# Patient Record
Sex: Male | Born: 1965 | Race: White | Hispanic: No | Marital: Married | Smoking: Never smoker
Health system: Southern US, Community
[De-identification: ages and names within clinical notes are randomized; demographics above are authoritative.]

## PROBLEM LIST (undated history)

## (undated) DIAGNOSIS — N200 Calculus of kidney: Secondary | ICD-10-CM

## (undated) DIAGNOSIS — Z87442 Personal history of urinary calculi: Secondary | ICD-10-CM

## (undated) DIAGNOSIS — K219 Gastro-esophageal reflux disease without esophagitis: Secondary | ICD-10-CM

## (undated) DIAGNOSIS — I10 Essential (primary) hypertension: Secondary | ICD-10-CM

## (undated) HISTORY — PX: KNEE ARTHROSCOPY W/ MENISCAL REPAIR: SHX1877

---

## 1998-11-11 ENCOUNTER — Encounter: Payer: Self-pay | Admitting: Otolaryngology

## 1998-11-11 ENCOUNTER — Encounter: Admission: RE | Admit: 1998-11-11 | Discharge: 1998-11-11 | Payer: Self-pay | Admitting: Otolaryngology

## 2002-03-13 ENCOUNTER — Ambulatory Visit (HOSPITAL_COMMUNITY): Admission: RE | Admit: 2002-03-13 | Discharge: 2002-03-13 | Payer: Self-pay | Admitting: Gastroenterology

## 2002-03-13 ENCOUNTER — Encounter: Payer: Self-pay | Admitting: Gastroenterology

## 2002-03-25 ENCOUNTER — Ambulatory Visit (HOSPITAL_COMMUNITY): Admission: RE | Admit: 2002-03-25 | Discharge: 2002-03-25 | Payer: Self-pay | Admitting: Gastroenterology

## 2002-03-25 ENCOUNTER — Encounter: Payer: Self-pay | Admitting: Gastroenterology

## 2004-02-20 ENCOUNTER — Ambulatory Visit (HOSPITAL_COMMUNITY): Admission: RE | Admit: 2004-02-20 | Discharge: 2004-02-20 | Payer: Self-pay | Admitting: *Deleted

## 2004-03-10 ENCOUNTER — Encounter (INDEPENDENT_AMBULATORY_CARE_PROVIDER_SITE_OTHER): Payer: Self-pay | Admitting: Specialist

## 2004-03-10 ENCOUNTER — Ambulatory Visit (HOSPITAL_COMMUNITY): Admission: RE | Admit: 2004-03-10 | Discharge: 2004-03-10 | Payer: Self-pay | Admitting: *Deleted

## 2006-07-03 ENCOUNTER — Emergency Department (HOSPITAL_COMMUNITY): Admission: EM | Admit: 2006-07-03 | Discharge: 2006-07-04 | Payer: Self-pay | Admitting: Emergency Medicine

## 2007-07-06 ENCOUNTER — Encounter: Admission: RE | Admit: 2007-07-06 | Discharge: 2007-07-06 | Payer: Self-pay | Admitting: Family Medicine

## 2010-11-18 ENCOUNTER — Encounter: Payer: Self-pay | Admitting: Gastroenterology

## 2010-12-29 ENCOUNTER — Other Ambulatory Visit: Payer: Self-pay | Admitting: Gastroenterology

## 2012-09-03 ENCOUNTER — Other Ambulatory Visit (HOSPITAL_COMMUNITY): Payer: Self-pay | Admitting: Orthopaedic Surgery

## 2012-12-29 ENCOUNTER — Encounter (HOSPITAL_COMMUNITY): Payer: Self-pay | Admitting: Emergency Medicine

## 2012-12-29 ENCOUNTER — Emergency Department (HOSPITAL_COMMUNITY): Payer: PRIVATE HEALTH INSURANCE

## 2012-12-29 ENCOUNTER — Emergency Department (HOSPITAL_COMMUNITY)
Admission: EM | Admit: 2012-12-29 | Discharge: 2012-12-29 | Disposition: A | Discharge status: Home / Self Care | Payer: PRIVATE HEALTH INSURANCE | Attending: Emergency Medicine | Admitting: Emergency Medicine

## 2012-12-29 DIAGNOSIS — R002 Palpitations: Secondary | ICD-10-CM | POA: Insufficient documentation

## 2012-12-29 DIAGNOSIS — I10 Essential (primary) hypertension: Secondary | ICD-10-CM | POA: Insufficient documentation

## 2012-12-29 DIAGNOSIS — K219 Gastro-esophageal reflux disease without esophagitis: Secondary | ICD-10-CM | POA: Insufficient documentation

## 2012-12-29 DIAGNOSIS — Z79899 Other long term (current) drug therapy: Secondary | ICD-10-CM | POA: Insufficient documentation

## 2012-12-29 HISTORY — DX: Essential (primary) hypertension: I10

## 2012-12-29 HISTORY — DX: Gastro-esophageal reflux disease without esophagitis: K21.9

## 2012-12-29 LAB — CBC
HCT: 46 % (ref 39.0–52.0)
Hemoglobin: 15.6 g/dL (ref 13.0–17.0)
MCH: 29.7 pg (ref 26.0–34.0)
MCHC: 33.9 g/dL (ref 30.0–36.0)
MCV: 87.5 fL (ref 78.0–100.0)
Platelets: 257 10*3/uL (ref 150–400)
WBC: 8.7 10*3/uL (ref 4.0–10.5)

## 2012-12-29 LAB — POCT I-STAT TROPONIN I: Troponin i, poc: 0 ng/mL (ref 0.00–0.08)

## 2012-12-29 LAB — BASIC METABOLIC PANEL
BUN: 12 mg/dL (ref 6–23)
Calcium: 9.3 mg/dL (ref 8.4–10.5)
Creatinine, Ser: 0.9 mg/dL (ref 0.50–1.35)
GFR calc non Af Amer: >90 mL/min (ref >90)
Glucose, Bld: 101 mg/dL — ABNORMAL HIGH (ref 70–99)
Potassium: 3.6 mEq/L (ref 3.5–5.1)

## 2012-12-29 NOTE — ED Provider Notes (Signed)
Complains of irregular heartbeat for approximately 3 days. Has been constant since 9 AM today. Presently asymptomatic. He denies having had any chest pain shortness of breath or nausea. On exam no distress lungs clear auscultation heart regular rate and rhythm no murmurs or rubs abdomen obese nontender extremities without edema. Patient had very occasional PVCs on heart monitor, otherwise normal sinus rhythm. Plan referral to primary care physician for event monitor  Doug Sou, MD 12/29/12 1242

## 2012-12-29 NOTE — ED Notes (Addendum)
Per pt sts for a few days he has been having intermittent chest pain and yesterday it began to be constant. sts pain is mid sternal and epigastric. Denies N,V,diaphoresis or SOB. sts he thought it was gas and took gas x without relief. sts he feels a fluttering in his chest

## 2012-12-29 NOTE — ED Provider Notes (Signed)
CSN: [id number]     Arrival date & time 12/29/12  1043 History   First MD Initiated Contact with Patient 12/29/12 1110     Chief Complaint  Patient presents with  . Chest Pain   (Consider location/radiation/quality/duration/timing/severity/associated sxs/prior Treatment) HPI Comments: Patient presents today with a chief complaint of palpitations.  He reports that he has had palpitations intermittently for the past 3 days.  He reports that the palpitations have been constant since 9 AM this morning.  He reports that he has had similar symptoms in the past with excessive caffeine intake. He reports that he typically drinks a can of Coke every morning, but denies any other caffeine use.  He denies any caffeine intake today.  He denies alcohol or recreational drug use.  Denies anxiety.  He denies any chest pain, SOB, nausea, vomiting, numbness, or tingling.  He denies any prior cardiac history.  No history of Cardiac Arrythmia.  He has a history of HTN, but no history of Hyperlipidemia or DM.  PCP is [facility].    The history is provided by the patient.    Past Medical History  Diagnosis Date  . Hypertension   . Acid reflux    History reviewed. No pertinent past surgical history. History reviewed. No pertinent family history. History  Substance Use Topics  . Smoking status: Never Smoker   . Smokeless tobacco: Not on file  . Alcohol Use: No    Review of Systems  Cardiovascular: Positive for palpitations.  All other systems reviewed and are negative.    Allergies  Bee venom  Home Medications   Current Outpatient Rx  Name  Route  Sig  Dispense  Refill  . EPINEPHrine (EPI-PEN) 0.3 mg/0.3 mL SOAJ injection   Intramuscular   Inject 0.3 mg into the muscle once.         . lansoprazole (PREVACID) 30 MG capsule   Oral   Take 30 mg by mouth daily at 12 noon.         Marland Kitchen lisinopril-hydrochlorothiazide (PRINZIDE,ZESTORETIC) 10-12.5 MG per tablet   Oral   Take 1 tablet  by mouth daily.          BP 124/77  Pulse 87  Temp(Src) 98.3 F (36.8 C)  Resp 15  SpO2 95% Physical Exam  Nursing note and vitals reviewed. Constitutional: He appears well-developed and well-nourished.  HENT:  Head: Normocephalic and atraumatic.  Mouth/Throat: Oropharynx is clear and moist.  Neck: Normal range of motion. Neck supple.  Cardiovascular: Normal rate, regular rhythm and normal heart sounds.   Pulmonary/Chest: Effort normal and breath sounds normal. No respiratory distress. He has no wheezes. He has no rales. He exhibits no tenderness.  Abdominal: Soft. Bowel sounds are normal. There is no tenderness.  Musculoskeletal: Normal range of motion.  Neurological: He is alert.  Skin: Skin is warm and dry.  Psychiatric: He has a normal mood and affect.    ED Course  Procedures (including critical care time) Labs Review Labs Reviewed  BASIC METABOLIC PANEL - Abnormal; Notable for the following:    Glucose, Bld 101 (*)    All other components within normal limits  CBC  POCT I-STAT TROPONIN I   Imaging Review Dg Chest 2 View  12/29/2012   CLINICAL DATA:  Chest palpitations  EXAM: CHEST  2 VIEW  COMPARISON:  10/31/2009  FINDINGS: The heart size and mediastinal contours are within normal limits. Both lungs are clear. The visualized skeletal structures are unremarkable.  IMPRESSION:  No active cardiopulmonary disease.   Electronically Signed   By: Signa Kell M.D.   On: 12/29/2012 11:45    EKG Interpretation   None       MDM  No diagnosis found. Patient presenting with a chief complaint of palpitations.  No prior cardiac history.  He denies CP or SOB.  Occasional PVC's observed on the cardiac monitor during ED course.  Labs unremarkable.  Troponin negative.  CXR negative.  Feel that the patient is stable for discharge.  Patient instructed to follow up with PCP for event monitor.  Return precautions given.   Santiago Glad, PA-C 12/29/12 2009

## 2012-12-29 NOTE — ED Notes (Signed)
Pt returned from xray

## 2012-12-30 NOTE — ED Provider Notes (Signed)
Medical screening examination/treatment/procedure(s) were conducted as a shared visit with non-physician practitioner(s) and myself.  I personally evaluated the patient during the encounter.  EKG Interpretation    Date/Time:  Saturday December 29 2012 10:46:40 EST Ventricular Rate:  95 PR Interval:  148 QRS Duration: 104 QT Interval:  350 QTC Calculation: 439 R Axis:   79 Text Interpretation:  Normal sinus rhythm Incomplete right bundle branch block Borderline ECG No old tracing to compare Confirmed by Ethelda Chick  MD, Duane Trias ([id number]) on 12/29/2012 12:40:41 PM             Doug Sou, MD 12/30/12 [phone]

## 2013-01-20 ENCOUNTER — Emergency Department (HOSPITAL_COMMUNITY)
Admission: EM | Admit: 2013-01-20 | Discharge: 2013-01-20 | Disposition: A | Discharge status: Home / Self Care | Payer: PRIVATE HEALTH INSURANCE | Attending: Emergency Medicine | Admitting: Emergency Medicine

## 2013-01-20 ENCOUNTER — Encounter (HOSPITAL_COMMUNITY): Payer: Self-pay | Admitting: Emergency Medicine

## 2013-01-20 ENCOUNTER — Emergency Department (HOSPITAL_COMMUNITY): Payer: PRIVATE HEALTH INSURANCE

## 2013-01-20 DIAGNOSIS — W010XXA Fall on same level from slipping, tripping and stumbling without subsequent striking against object, initial encounter: Secondary | ICD-10-CM | POA: Insufficient documentation

## 2013-01-20 DIAGNOSIS — Z79899 Other long term (current) drug therapy: Secondary | ICD-10-CM | POA: Insufficient documentation

## 2013-01-20 DIAGNOSIS — Y9367 Activity, basketball: Secondary | ICD-10-CM | POA: Insufficient documentation

## 2013-01-20 DIAGNOSIS — S0003XA Contusion of scalp, initial encounter: Secondary | ICD-10-CM | POA: Insufficient documentation

## 2013-01-20 DIAGNOSIS — W19XXXA Unspecified fall, initial encounter: Secondary | ICD-10-CM

## 2013-01-20 DIAGNOSIS — I10 Essential (primary) hypertension: Secondary | ICD-10-CM | POA: Insufficient documentation

## 2013-01-20 DIAGNOSIS — Y92009 Unspecified place in unspecified non-institutional (private) residence as the place of occurrence of the external cause: Secondary | ICD-10-CM | POA: Insufficient documentation

## 2013-01-20 DIAGNOSIS — K219 Gastro-esophageal reflux disease without esophagitis: Secondary | ICD-10-CM | POA: Insufficient documentation

## 2013-01-20 DIAGNOSIS — S0093XA Contusion of unspecified part of head, initial encounter: Secondary | ICD-10-CM

## 2013-01-20 NOTE — ED Provider Notes (Signed)
CSN: [id number]     Arrival date & time 01/20/13  1132 History  This chart was scribed for non-physician practitioner, Marlon Pel, PA-C working with Geoffery Lyons, MD by Greggory Stallion, ED scribe. This patient was seen in room TR09C/TR09C and the patient's care was started at 12:19 PM.   Chief Complaint  Patient presents with  . Fall  . Head Injury   The history is provided by the patient. No language interpreter was used.   HPI Comments: Scott Knox is a 47 y.o. male who presents to the Emergency Department complaining of a fall that occurred yesterday. States he tripped on a basketball goal while putting it together and fell face forward. Pt has gradual onset, constant headache above his right eye and mild jaw pain. He states he had intermittent dizziness but it is resolved currently. Pt has had two episodes of dizziness. Denies LOC. Pt states he has been ambulating normally. Denies extremity weakness, difficulty swallowing, speech difficulty.    Past Medical History  Diagnosis Date  . Hypertension   . Acid reflux    History reviewed. No pertinent past surgical history. History reviewed. No pertinent family history. History  Substance Use Topics  . Smoking status: Never Smoker   . Smokeless tobacco: Not on file  . Alcohol Use: No    Review of Systems  HENT: Negative for trouble swallowing.   Musculoskeletal: Positive for arthralgias (jaw).  Neurological: Positive for headaches. Negative for dizziness, speech difficulty and weakness.  All other systems reviewed and are negative.   Allergies  Bee venom  Home Medications   Current Outpatient Rx  Name  Route  Sig  Dispense  Refill  . EPINEPHrine (EPI-PEN) 0.3 mg/0.3 mL SOAJ injection   Intramuscular   Inject 0.3 mg into the muscle once.         . lansoprazole (PREVACID) 30 MG capsule   Oral   Take 30 mg by mouth daily at 12 noon.         Marland Kitchen lisinopril-hydrochlorothiazide (PRINZIDE,ZESTORETIC) 10-12.5 MG per  tablet   Oral   Take 1 tablet by mouth daily.          BP 153/100  Pulse 97  Temp(Src) 98.3 F (36.8 C) (Oral)  Resp 18  SpO2 95%  Physical Exam  Nursing note and vitals reviewed. Constitutional: He is oriented to person, place, and time. He appears well-developed and well-nourished. No distress.  HENT:  Head: Normocephalic and atraumatic.  Eyes: EOM are normal. Pupils are equal, round, and reactive to light.  Racoon eye to right eye. Negative battle signs.   Neck: Neck supple. No tracheal deviation present.  No cervical tenderness.   Cardiovascular: Normal rate.   Pulmonary/Chest: Effort normal. No respiratory distress.  Musculoskeletal: Normal range of motion.  Neurological: He is alert and oriented to person, place, and time. He has normal strength. No cranial nerve deficit or sensory deficit. He displays a negative Romberg sign. GCS eye subscore is 4. GCS verbal subscore is 5. GCS motor subscore is 6.  Skin: Skin is warm and dry.  Psychiatric: He has a normal mood and affect. His behavior is normal.    ED Course  Procedures (including critical care time)  DIAGNOSTIC STUDIES: Oxygen Saturation is 95% on RA, adequate by my interpretation.    COORDINATION OF CARE: 12:22 PM-Discussed treatment plan which includes head CT with pt at bedside and pt agreed to plan.   Labs Review Labs Reviewed - No data to display Imaging Review  Ct Head Wo Contrast  01/20/2013   CLINICAL DATA:  Status post fall 1 day ago.  Headache.  EXAM: CT HEAD WITHOUT CONTRAST  TECHNIQUE: Contiguous axial images were obtained from the base of the skull through the vertex without intravenous contrast.  COMPARISON:  None.  FINDINGS: The brain appears normal without infarct, hemorrhage, mass lesion, mass effect, midline shift or abnormal extra-axial fluid collection. There is no hydrocephalus or pneumocephalus. The calvarium is intact. Imaged paranasal sinuses and mastoid air cells are clear.  IMPRESSION:  Negative head CT.   Electronically Signed   By: Drusilla Kanner M.D.   On: 01/20/2013 12:53    EKG Interpretation   None       MDM   1. Fall at home, initial encounter   2. Head contusion, initial encounter    Normal Head CT, normal neuro exam. Dizziness is most likely due to concussion. Pt to follow-up with PCP.  47 y.o.Scott Knox Shoulder evaluation in the Emergency Department is complete. It has been determined that no acute conditions requiring further emergency intervention are present at this time. The patient/guardian have been advised of the diagnosis and plan. We have discussed signs and symptoms that warrant return to the ED, such as changes or worsening in symptoms.  Vital signs are stable at discharge. Filed Vitals:   01/20/13 1154  BP: 153/100  Pulse: 97  Temp: 98.3 F (36.8 C)  Resp: 18    Patient/guardian has voiced understanding and agreed to follow-up with the PCP or specialist.  I personally performed the services described in this documentation, which was scribed in my presence. The recorded information has been reviewed and is accurate.   [name], PA-C 01/20/13 1311

## 2013-01-20 NOTE — ED Provider Notes (Signed)
Medical screening examination/treatment/procedure(s) were performed by non-physician practitioner and as supervising physician I was immediately available for consultation/collaboration.     Deserai Cansler, MD 01/20/13 1452 

## 2013-01-20 NOTE — ED Notes (Signed)
Pt sts fell onto face yesterday after tripping and c/o HA today above right eye with mild dizziness that is resolved; pt denies LOC

## 2019-01-16 ENCOUNTER — Ambulatory Visit: Payer: PRIVATE HEALTH INSURANCE | Attending: Internal Medicine

## 2019-01-16 DIAGNOSIS — Z20822 Contact with and (suspected) exposure to covid-19: Secondary | ICD-10-CM

## 2019-01-17 LAB — NOVEL CORONAVIRUS, NAA: SARS-CoV-2, NAA: NOT DETECTED

## 2019-02-09 ENCOUNTER — Emergency Department: Payer: PRIVATE HEALTH INSURANCE

## 2019-02-09 ENCOUNTER — Other Ambulatory Visit: Payer: Self-pay

## 2019-02-09 ENCOUNTER — Emergency Department
Admission: EM | Admit: 2019-02-09 | Discharge: 2019-02-09 | Disposition: A | Discharge status: Home / Self Care | Payer: PRIVATE HEALTH INSURANCE | Attending: Emergency Medicine | Admitting: Emergency Medicine

## 2019-02-09 DIAGNOSIS — I10 Essential (primary) hypertension: Secondary | ICD-10-CM | POA: Diagnosis not present

## 2019-02-09 DIAGNOSIS — N200 Calculus of kidney: Secondary | ICD-10-CM | POA: Insufficient documentation

## 2019-02-09 DIAGNOSIS — R109 Unspecified abdominal pain: Secondary | ICD-10-CM | POA: Diagnosis present

## 2019-02-09 DIAGNOSIS — Z79899 Other long term (current) drug therapy: Secondary | ICD-10-CM | POA: Insufficient documentation

## 2019-02-09 DIAGNOSIS — R197 Diarrhea, unspecified: Secondary | ICD-10-CM | POA: Diagnosis not present

## 2019-02-09 DIAGNOSIS — R3915 Urgency of urination: Secondary | ICD-10-CM | POA: Insufficient documentation

## 2019-02-09 LAB — URINALYSIS, COMPLETE (UACMP) WITH MICROSCOPIC
Bacteria, UA: NONE SEEN
Bilirubin Urine: NEGATIVE
Glucose, UA: NEGATIVE mg/dL
Ketones, ur: NEGATIVE mg/dL
Leukocytes,Ua: NEGATIVE
Nitrite: NEGATIVE
Protein, ur: NEGATIVE mg/dL
RBC / HPF: >50 RBC/hpf — ABNORMAL HIGH (ref 0–5)
Specific Gravity, Urine: 1.015 (ref 1.005–1.030)
Squamous Epithelial / LPF: NONE SEEN (ref 0–5)
pH: 5 (ref 5.0–8.0)

## 2019-02-09 LAB — CBC
HCT: 45.3 % (ref 39.0–52.0)
Hemoglobin: 14.9 g/dL (ref 13.0–17.0)
MCH: 28.7 pg (ref 26.0–34.0)
MCHC: 32.9 g/dL (ref 30.0–36.0)
MCV: 87.3 fL (ref 80.0–100.0)
Platelets: 243 10*3/uL (ref 150–400)
RBC: 5.19 MIL/uL (ref 4.22–5.81)
RDW: 12.8 % (ref 11.5–15.5)
WBC: 11.3 10*3/uL — ABNORMAL HIGH (ref 4.0–10.5)
nRBC: 0 % (ref 0.0–0.2)

## 2019-02-09 LAB — COMPREHENSIVE METABOLIC PANEL
ALT: 31 U/L (ref 0–44)
AST: 26 U/L (ref 15–41)
Albumin: 4.6 g/dL (ref 3.5–5.0)
Alkaline Phosphatase: 60 U/L (ref 38–126)
Anion gap: 10 (ref 5–15)
BUN: 15 mg/dL (ref 6–20)
CO2: 28 mmol/L (ref 22–32)
Calcium: 9.3 mg/dL (ref 8.9–10.3)
Chloride: 102 mmol/L (ref 98–111)
Creatinine, Ser: 1.24 mg/dL (ref 0.61–1.24)
GFR calc Af Amer: >60 mL/min (ref >60)
GFR calc non Af Amer: >60 mL/min (ref >60)
Glucose, Bld: 108 mg/dL — ABNORMAL HIGH (ref 70–99)
Potassium: 3.7 mmol/L (ref 3.5–5.1)
Sodium: 140 mmol/L (ref 135–145)
Total Bilirubin: 0.6 mg/dL (ref 0.3–1.2)
Total Protein: 7.6 g/dL (ref 6.5–8.1)

## 2019-02-09 LAB — LIPASE, BLOOD: Lipase: 25 U/L (ref 11–51)

## 2019-02-09 MED ORDER — KETOROLAC TROMETHAMINE 30 MG/ML IJ SOLN
30.0000 mg | Freq: Once | INTRAMUSCULAR | Status: AC
  Administered 2019-02-09: 21:00:00 30 mg via INTRAVENOUS
  Filled 2019-02-09: qty 1

## 2019-02-09 MED ORDER — IBUPROFEN 600 MG PO TABS: 600.0000 mg | ORAL_TABLET | Freq: Four times a day (QID) | ORAL | 0 refills | Status: DC | PRN

## 2019-02-09 MED ORDER — MORPHINE SULFATE (PF) 2 MG/ML IV SOLN
2.0000 mg | Freq: Once | INTRAVENOUS | Status: AC
  Administered 2019-02-09: 19:00:00 2 mg via INTRAVENOUS
  Filled 2019-02-09: qty 1

## 2019-02-09 MED ORDER — OXYCODONE-ACETAMINOPHEN 5-325 MG PO TABS
1.0000 | ORAL_TABLET | Freq: Once | ORAL | Status: AC
  Administered 2019-02-09: 18:00:00 1 via ORAL
  Filled 2019-02-09: qty 1

## 2019-02-09 MED ORDER — SODIUM CHLORIDE 0.9% FLUSH: 3.0000 mL | Freq: Once | INTRAVENOUS | Status: DC

## 2019-02-09 MED ORDER — IBUPROFEN 600 MG PO TABS: 600.0000 mg | ORAL_TABLET | Freq: Four times a day (QID) | ORAL | 0 refills | Status: AC | PRN

## 2019-02-09 MED ORDER — OXYCODONE-ACETAMINOPHEN 5-325 MG PO TABS: 1.0000 | ORAL_TABLET | Freq: Three times a day (TID) | ORAL | 0 refills | Status: AC | PRN

## 2019-02-09 MED ORDER — OXYCODONE-ACETAMINOPHEN 5-325 MG PO TABS: 1.0000 | ORAL_TABLET | Freq: Three times a day (TID) | ORAL | 0 refills | Status: DC | PRN

## 2019-02-09 NOTE — ED Provider Notes (Signed)
[facility] Emergency Department Provider Note  ____________________________________________  Time seen: Approximately 5:55 PM  I have reviewed the triage vital signs and the nursing notes.   HISTORY  Chief Complaint Abdominal Pain    HPI Scott Knox is a 54 y.o. male that presents to the emergency department for evaluation of left sided flank pain for 2 days and urinary urgency today.  Patient states that pain starts in his back and radiates down toward his groin.  Patient had a similar episode but on the right side several weeks ago which resolved on its own.  He did have kidney stones about 30 years ago and symptoms feel the same.  Patient has had episodes of loose stools for 2 days.  No blood or mucus in his stools.  This started after he ate ice cream.  Patient thinks that he is lactose intolerant so he usually stays away from ice cream.  He has not had a fever.  He has not noticed any blood in his urine.     Past Medical History:  Diagnosis Date  . Acid reflux   . Hypertension     There are no problems to display for this patient.   History reviewed. No pertinent surgical history.  Prior to Admission medications   Medication Sig Start Date End Date Taking? Authorizing Provider  EPINEPHrine (EPI-PEN) 0.3 mg/0.3 mL SOAJ injection Inject 0.3 mg into the muscle once.    [provider]  ibuprofen (ADVIL) 600 MG tablet Take 1 tablet (600 mg total) by mouth every 6 (six) hours as needed. 02/09/19   Enid Derry, PA-C  lansoprazole (PREVACID) 30 MG capsule Take 30 mg by mouth daily at 12 noon.    [provider]  lisinopril-hydrochlorothiazide (PRINZIDE,ZESTORETIC) 10-12.5 MG per tablet Take 1 tablet by mouth daily. 12/25/12   [provider]  oxyCODONE-acetaminophen (PERCOCET) 5-325 MG tablet Take 1 tablet by mouth every 8 (eight) hours as needed for up to 3 days for severe pain. 02/09/19 02/12/19  Enid Derry, PA-C     Allergies Bee venom  No family history on file.  Social History Social History   Tobacco Use  . Smoking status: Never Smoker  . Smokeless tobacco: Never Used  Substance Use Topics  . Alcohol use: Yes  . Drug use: No     Review of Systems  Constitutional: No fever/chills Cardiovascular: No chest pain. Respiratory: No cough. No SOB. Gastrointestinal: Positive for left flank pain.  No nausea, no vomiting.  Genitourinary: Positive for dysuria. Musculoskeletal: Negative for musculoskeletal pain. Skin: Negative for rash, abrasions, lacerations, ecchymosis.   ____________________________________________   PHYSICAL EXAM:  VITAL SIGNS: ED Triage Vitals  Enc Vitals Group     BP 02/09/19 1348 (!) 151/103     Pulse Rate 02/09/19 1348 95     Resp 02/09/19 1348 18     Temp 02/09/19 1348 97.7 F (36.5 C)     Temp Source 02/09/19 1348 Oral     SpO2 02/09/19 1348 97 %     Weight 02/09/19 1349 290 lb (131.5 kg)     Height 02/09/19 1349 5\' 10"  (1.778 m)     Head Circumference --      Peak Flow --      Pain Score 02/09/19 1349 9     Pain Loc --      Pain Edu? --      Excl. in GC? --      Constitutional: Alert and oriented. Well appearing and  in no acute distress. Eyes: Conjunctivae are normal. PERRL. EOMI. Head: Atraumatic. ENT:      Ears:      Nose: No congestion/rhinnorhea.      Mouth/Throat: Mucous membranes are moist.  Neck: No stridor.  Cardiovascular: Normal rate, regular rhythm.  Good peripheral circulation. Respiratory: Normal respiratory effort without tachypnea or retractions. Lungs CTAB. Good air entry to the bases with no decreased or absent breath sounds. Gastrointestinal: Bowel sounds 4 quadrants. Soft and nontender to palpation. No guarding or rigidity. No palpable masses. No distention. No CVA tenderness. Musculoskeletal: Full range of motion to all extremities. No gross deformities appreciated. Neurologic:  Normal speech and language. No gross  focal neurologic deficits are appreciated.  Skin:  Skin is warm, dry and intact. No rash noted. Psychiatric: Mood and affect are normal. Speech and behavior are normal. Patient exhibits appropriate insight and judgement.   ____________________________________________   LABS (all labs ordered are listed, but only abnormal results are displayed)  Labs Reviewed  COMPREHENSIVE METABOLIC PANEL - Abnormal; Notable for the following components:      Result Value   Glucose, Bld 108 (*)    All other components within normal limits  CBC - Abnormal; Notable for the following components:   WBC 11.3 (*)    All other components within normal limits  URINALYSIS, COMPLETE (UACMP) WITH MICROSCOPIC - Abnormal; Notable for the following components:   Color, Urine YELLOW (*)    APPearance CLEAR (*)    Hgb urine dipstick LARGE (*)    RBC / HPF >50 (*)    All other components within normal limits  LIPASE, BLOOD   ____________________________________________  EKG   ____________________________________________  RADIOLOGY   CT Renal Stone Study  Result Date: 02/09/2019 CLINICAL DATA:  54 year old male with left lower quadrant pain radiating to the groin and hematuria. EXAM: CT ABDOMEN AND PELVIS WITHOUT CONTRAST TECHNIQUE: Multidetector CT imaging of the abdomen and pelvis was performed following the standard protocol without IV contrast. COMPARISON:  None. FINDINGS: Evaluation of this exam is limited in the absence of intravenous contrast. Lower chest: The visualized lung bases are clear. No intra-abdominal free air or free fluid. Hepatobiliary: Diffuse fatty infiltration of the liver. No intrahepatic biliary ductal dilatation. No calcified gallstone or pericholecystic fluid. Pancreas: Unremarkable. No pancreatic ductal dilatation or surrounding inflammatory changes. Spleen: Normal in size without focal abnormality. Adrenals/Urinary Tract: The adrenal glands are unremarkable. There is a 3 mm stone  along the posterior bladder wall adjacent to the left ureterovesical junction which may represent a recently passed left renal calculus versus less likely a left UVJ stone. There is mild left hydronephrosis. Multiple additional nonobstructing bilateral renal calculi noted measuring up to 4-5 mm in the inferior pole of the left kidney. There is no hydronephrosis on the right. The right ureter is unremarkable. Stomach/Bowel: There is no bowel obstruction or active inflammation. The appendix is normal. Vascular/Lymphatic: Mild atherosclerotic calcification of the aorta. The IVC is unremarkable. No portal venous gas. There is no adenopathy. Reproductive: The prostate and seminal vesicles are grossly unremarkable. No pelvic mass. Other: None Musculoskeletal: Degenerative changes of the spine. No acute osseous pathology. IMPRESSION: 1. A 3 mm recently passed left renal calculus versus less likely a left UVJ stone with mild left hydronephrosis. 2. Additional nonobstructing bilateral renal calculi. There is no hydronephrosis on the right. 3. Fatty liver. 4. No bowel obstruction or active inflammation.  Normal appendix. Electronically Signed   By: Elgie Collard M.D.   On:  02/09/2019 19:46    ____________________________________________    PROCEDURES  Procedure(s) performed:    Procedures    Medications  sodium chloride flush (NS) 0.9 % injection 3 mL (3 mLs Intravenous Not Given 02/09/19 1821)  oxyCODONE-acetaminophen (PERCOCET/ROXICET) 5-325 MG per tablet 1 tablet (1 tablet Oral Given 02/09/19 1819)  morphine 2 MG/ML injection 2 mg (2 mg Intravenous Given 02/09/19 1929)  ketorolac (TORADOL) 30 MG/ML injection 30 mg (30 mg Intravenous Given 02/09/19 2101)     ____________________________________________   INITIAL IMPRESSION / ASSESSMENT AND PLAN / ED COURSE  Pertinent labs & imaging results that were available during my care of the patient were reviewed by me and considered in my medical decision  making (see chart for details).  Review of the [place] CSRS was performed in accordance of the [place] prior to dispensing any controlled drugs.    Patient's diagnosis is consistent with nephrolithiasis.  Vital signs and exam are reassuring.  White blood cell count mildly elevated at 11.3.  CMP largely unremarkable.  CT renal stone study shows some mild left-sided hydronephrosis with a probable recently passed 3 mm kidney stone and several nonobstructing kidney stones.  No indication of infection on urinalysis.  Patient was given Percocet, morphine, Toradol for pain.  Patient will be discharged home with prescriptions for ibuprofen and a short course of Percocet. Patient is to follow up with urology as directed.  Referral was given.  Patient is given ED precautions to return to the ED for any worsening or new symptoms.  Lexington Krotz was evaluated in Emergency Department on 02/09/2019 for the symptoms described in the history of present illness. He was evaluated in the context of the global COVID-19 pandemic, which necessitated consideration that the patient might be at risk for infection with the SARS-CoV-2 virus that causes COVID-19. Institutional protocols and algorithms that pertain to the evaluation of patients at risk for COVID-19 are in a state of rapid change based on information released by regulatory bodies including the CDC and federal and state organizations. These policies and algorithms were followed during the patient's care in the ED.   ____________________________________________  FINAL CLINICAL IMPRESSION(S) / ED DIAGNOSES  Final diagnoses:  Nephrolithiasis      NEW MEDICATIONS STARTED DURING THIS VISIT:  ED Discharge Orders         Ordered    ibuprofen (ADVIL) 600 MG tablet  Every 6 hours PRN     02/09/19 2049    oxyCODONE-acetaminophen (PERCOCET) 5-325 MG tablet  Every 8 hours PRN     02/09/19 2049              This chart was dictated using voice recognition  software/Dragon. Despite best efforts to proofread, errors can occur which can change the meaning. Any change was purely unintentional.    Laban Emperor, PA-C 02/09/19 2132    Arta Silence, MD 02/09/19 2241

## 2019-02-09 NOTE — ED Notes (Signed)
Per PA Morrie Sheldon, she will change RX's to be sent to [facility] in [place].

## 2019-02-09 NOTE — ED Notes (Signed)
First Nurse Note: Pt to ED via POV for left lower abdominal pain. Pt is in NAD at this time.

## 2019-02-09 NOTE — ED Triage Notes (Signed)
Pt c/o LLQ pain that radiates into the groin and flank area with diarrhea since last night, pt has a hx of kidney stones about 30 years ago, denies hx of diverticulitis.

## 2019-02-10 ENCOUNTER — Telehealth: Payer: Self-pay | Admitting: Emergency Medicine

## 2019-02-10 MED ORDER — OXYCODONE-ACETAMINOPHEN 5-325 MG PO TABS: 1.0000 | ORAL_TABLET | Freq: Three times a day (TID) | ORAL | 0 refills | Status: DC | PRN

## 2019-02-10 MED ORDER — IBUPROFEN 600 MG PO TABS: 600.0000 mg | ORAL_TABLET | Freq: Four times a day (QID) | ORAL | 0 refills | Status: DC | PRN

## 2019-02-13 ENCOUNTER — Other Ambulatory Visit: Payer: Self-pay

## 2019-02-13 ENCOUNTER — Ambulatory Visit (INDEPENDENT_AMBULATORY_CARE_PROVIDER_SITE_OTHER): Payer: PRIVATE HEALTH INSURANCE | Admitting: Urology

## 2019-02-13 ENCOUNTER — Encounter: Payer: Self-pay | Admitting: Urology

## 2019-02-13 ENCOUNTER — Ambulatory Visit
Admission: RE | Admit: 2019-02-13 | Discharge: 2019-02-13 | Disposition: A | Discharge status: Home / Self Care | Payer: PRIVATE HEALTH INSURANCE | Source: Ambulatory Visit | Attending: Urology | Admitting: Urology

## 2019-02-13 VITALS — BP 155/83 | HR 96 | Ht 70.0 in | Wt 285.0 lb

## 2019-02-13 DIAGNOSIS — N200 Calculus of kidney: Secondary | ICD-10-CM | POA: Diagnosis present

## 2019-02-13 LAB — URINALYSIS, COMPLETE
Bilirubin, UA: NEGATIVE
Glucose, UA: NEGATIVE
Ketones, UA: NEGATIVE
Leukocytes,UA: NEGATIVE
Nitrite, UA: NEGATIVE
Protein,UA: NEGATIVE
Specific Gravity, UA: 1.025 (ref 1.005–1.030)
Urobilinogen, Ur: 0.2 mg/dL (ref 0.2–1.0)
pH, UA: 6.5 (ref 5.0–7.5)

## 2019-02-13 LAB — MICROSCOPIC EXAMINATION
Bacteria, UA: NONE SEEN
Epithelial Cells (non renal): NONE SEEN /hpf (ref 0–10)

## 2019-02-13 NOTE — Progress Notes (Signed)
02/13/2019 12:45 PM   Scott Knox [id number] [id number]  Referring provider: No referring provider defined for this encounter.  Chief Complaint  Patient presents with  . Nephrolithiasis    HPI: Scott Knox is a 54 y.o. male who presented to the ED on 02/09/2019 with a 2-day history of left flank pain and onset of urinary urgency the day of his ED visit.  There were no identifiable precipitating, aggravating or alleviating factors.  Intensity was rated severe.  He had a similar episode of right flank pain just prior to Christmas.  A stone protocol CT was performed which showed a 3 mm calculus either within the bladder or at the left UVJ.  There was mild left hydronephrosis noted.  He also had bilateral, nonobstructing renal calculi. His pain has significantly improved and states it is presently minimal.  He had a prior stone episode 30 years ago which he passed.   PMH: Past Medical History:  Diagnosis Date  . Acid reflux   . Hypertension     Surgical History: History reviewed. No pertinent surgical history.  Home Medications:  Allergies as of 02/13/2019      Reactions   Bee Venom Anaphylaxis      Medication List       Accurate as of February 13, 2019 12:45 PM. If you have any questions, ask your nurse or doctor.        STOP taking these medications   oxyCODONE-acetaminophen 5-325 MG tablet Commonly known as: Percocet Stopped by: Abbie Sons, MD     TAKE these medications   allopurinol 100 MG tablet Commonly known as: ZYLOPRIM Take 100 mg by mouth daily.   amLODipine 5 MG tablet Commonly known as: NORVASC Take 5 mg by mouth daily.   EPINEPHrine 0.3 mg/0.3 mL Soaj injection Commonly known as: EPI-PEN Inject 0.3 mg into the muscle once.   ibuprofen 600 MG tablet Commonly known as: ADVIL Take 1 tablet (600 mg total) by mouth every 6 (six) hours as needed. What changed: Another medication with the same name was removed. Continue taking this  medication, and follow the directions you see here. Changed by: Abbie Sons, MD   lansoprazole 30 MG capsule Commonly known as: PREVACID Take 30 mg by mouth daily at 12 noon.   lisinopril-hydrochlorothiazide 10-12.5 MG tablet Commonly known as: ZESTORETIC Take 1 tablet by mouth daily.       Allergies:  Allergies  Allergen Reactions  . Bee Venom Anaphylaxis    Family History: History reviewed. No pertinent family history.  Social History:  reports that he has never smoked. He has never used smokeless tobacco. He reports current alcohol use. He reports that he does not use drugs.  ROS: UROLOGY Frequent Urination?: Yes Hard to postpone urination?: No Burning/pain with urination?: Yes Get up at night to urinate?: No Leakage of urine?: No Urine stream starts and stops?: No Trouble starting stream?: No Do you have to strain to urinate?: No Blood in urine?: Yes Urinary tract infection?: No Sexually transmitted disease?: No Injury to kidneys or bladder?: No Painful intercourse?: No Weak stream?: No Erection problems?: No Penile pain?: No  Gastrointestinal Nausea?: No Vomiting?: No Indigestion/heartburn?: Yes Diarrhea?: No Constipation?: Yes  Constitutional Fever: No Night sweats?: No Weight loss?: No Fatigue?: No  Skin Skin rash/lesions?: No Itching?: No  Eyes Blurred vision?: No Double vision?: No  Ears/Nose/Throat Sore throat?: No Sinus problems?: Yes  Hematologic/Lymphatic Swollen glands?: No Easy bruising?: No  Cardiovascular Leg swelling?: No Chest  pain?: No  Respiratory Cough?: No Shortness of breath?: No  Endocrine Excessive thirst?: No  Musculoskeletal Back pain?: No Joint pain?: No  Neurological Headaches?: No Dizziness?: No  Psychologic Depression?: No Anxiety?: No  Physical Exam: BP (!) 155/83   Pulse 96   Ht 5\' 10"  (1.778 m)   Wt 285 lb (129.3 kg)   BMI 40.89 kg/m   Constitutional:  Alert and oriented, No  acute distress. HEENT: [place] AT, moist mucus membranes.  Trachea midline, no masses. Cardiovascular: No clubbing, cyanosis, or edema. Respiratory: Normal respiratory effort, no increased work of breathing. Skin: No rashes, bruises or suspicious lesions. Neurologic: Grossly intact, no focal deficits, moving all 4 extremities. Psychiatric: Normal mood and affect.   Pertinent Imaging: Images were personally reviewed.  The right-sided calcifications are most likely intraparenchymal. Results for orders placed during the hospital encounter of 02/09/19  CT Renal Stone Study   Narrative CLINICAL DATA:  54 year old male with left lower quadrant pain radiating to the groin and hematuria.  EXAM: CT ABDOMEN AND PELVIS WITHOUT CONTRAST  TECHNIQUE: Multidetector CT imaging of the abdomen and pelvis was performed following the standard protocol without IV contrast.  COMPARISON:  None.  FINDINGS: Evaluation of this exam is limited in the absence of intravenous contrast.  Lower chest: The visualized lung bases are clear.  No intra-abdominal free air or free fluid.  Hepatobiliary: Diffuse fatty infiltration of the liver. No intrahepatic biliary ductal dilatation. No calcified gallstone or pericholecystic fluid.  Pancreas: Unremarkable. No pancreatic ductal dilatation or surrounding inflammatory changes.  Spleen: Normal in size without focal abnormality.  Adrenals/Urinary Tract: The adrenal glands are unremarkable. There is a 3 mm stone along the posterior bladder wall adjacent to the left ureterovesical junction which may represent a recently passed left renal calculus versus less likely a left UVJ stone. There is mild left hydronephrosis. Multiple additional nonobstructing bilateral renal calculi noted measuring up to 4-5 mm in the inferior pole of the left kidney. There is no hydronephrosis on the right. The right ureter is unremarkable.  Stomach/Bowel: There is no bowel obstruction  or active inflammation. The appendix is normal.  Vascular/Lymphatic: Mild atherosclerotic calcification of the aorta. The IVC is unremarkable. No portal venous gas. There is no adenopathy.  Reproductive: The prostate and seminal vesicles are grossly unremarkable. No pelvic mass.  Other: None  Musculoskeletal: Degenerative changes of the spine. No acute osseous pathology.  IMPRESSION: 1. A 3 mm recently passed left renal calculus versus less likely a left UVJ stone with mild left hydronephrosis. 2. Additional nonobstructing bilateral renal calculi. There is no hydronephrosis on the right. 3. Fatty liver. 4. No bowel obstruction or active inflammation.  Normal appendix.   Electronically Signed   By: 45 M.D.   On: 02/09/2019 19:46     Assessment & Plan:    - Nephrolithiasis Recent episode of renal colic.  His severe pain has resolved though he does have mild pelvic discomfort.  KUB was ordered and he will be notified with results.  He has bilateral, nonobstructing renal calculi and I have recommended a metabolic evaluation to include a 24 urine study and blood work.   02/11/2019, MD  [facility] [address] [place], [place] [phone]

## 2019-02-15 ENCOUNTER — Other Ambulatory Visit: Payer: Self-pay | Admitting: Urology

## 2019-02-15 ENCOUNTER — Telehealth: Payer: Self-pay | Admitting: *Deleted

## 2019-02-15 DIAGNOSIS — N132 Hydronephrosis with renal and ureteral calculous obstruction: Secondary | ICD-10-CM

## 2019-02-15 DIAGNOSIS — N201 Calculus of ureter: Secondary | ICD-10-CM

## 2019-02-15 NOTE — Telephone Encounter (Signed)
-----   Message from Riki Altes, MD sent at 02/15/2019  8:11 AM EST ----- KUB reviewed.  There are small, bilateral renal calculi.  I do not see the calculus that was in the ureter and it may have passed.  Recommend scheduling renal ultrasound to document resolution of the left kidney blockage.  Order was entered and will call with results.

## 2019-02-27 ENCOUNTER — Other Ambulatory Visit: Payer: Self-pay

## 2019-02-27 ENCOUNTER — Ambulatory Visit
Admission: RE | Admit: 2019-02-27 | Discharge: 2019-02-27 | Disposition: A | Discharge status: Home / Self Care | Payer: PRIVATE HEALTH INSURANCE | Source: Ambulatory Visit | Attending: Urology | Admitting: Urology

## 2019-02-27 DIAGNOSIS — N132 Hydronephrosis with renal and ureteral calculous obstruction: Secondary | ICD-10-CM | POA: Diagnosis not present

## 2019-03-04 ENCOUNTER — Other Ambulatory Visit: Payer: Self-pay | Admitting: *Deleted

## 2019-03-04 DIAGNOSIS — N200 Calculus of kidney: Secondary | ICD-10-CM

## 2019-03-04 DIAGNOSIS — N201 Calculus of ureter: Secondary | ICD-10-CM

## 2019-03-06 ENCOUNTER — Other Ambulatory Visit: Payer: PRIVATE HEALTH INSURANCE

## 2019-09-02 ENCOUNTER — Ambulatory Visit: Payer: PRIVATE HEALTH INSURANCE | Admitting: Urology

## 2021-11-01 IMAGING — CT CT RENAL STONE PROTOCOL
2 of 4 series · 16 of 46 positions shown, 18 images · non-contrast
Comparison: None.

CLINICAL DATA: 51-year-old male with left lower quadrant pain
radiating to the groin and hematuria.

EXAM:
CT ABDOMEN AND PELVIS WITHOUT CONTRAST
TECHNIQUE: Multidetector CT imaging of the abdomen and pelvis was performed
following the standard protocol without IV contrast.

[Series 2: stone full standard · axial · 0.86mm/px · z∈[-995,-510]mm · 13 of 107 slices shown, 15 images]
[im 5/107  soft-tissue]
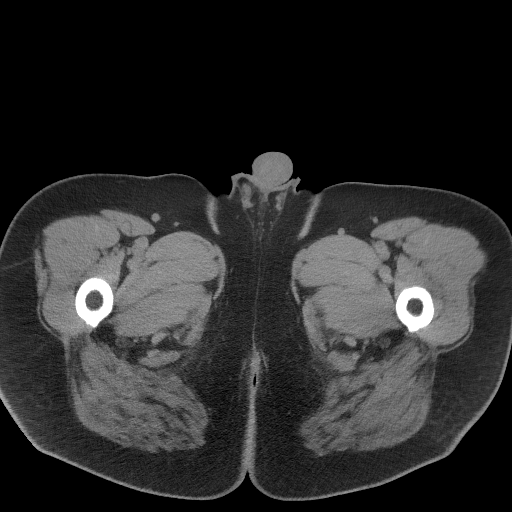
[im 5/107  bone]
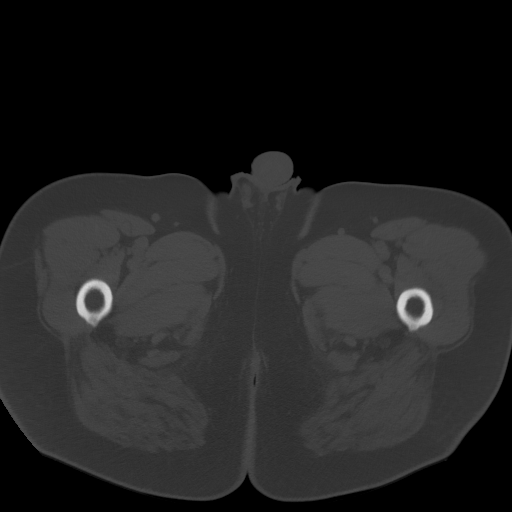
[im 14/107  soft-tissue]
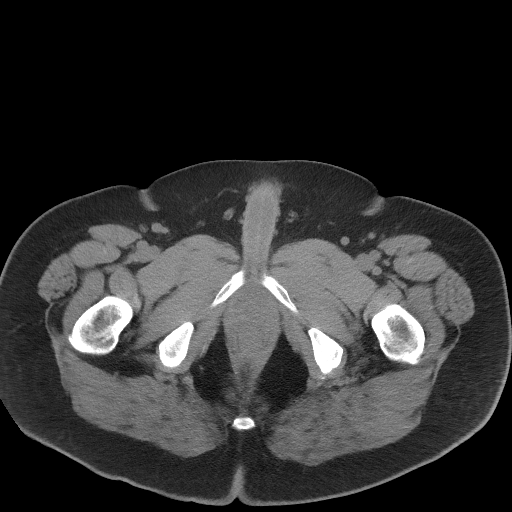
[im 23/107  soft-tissue]
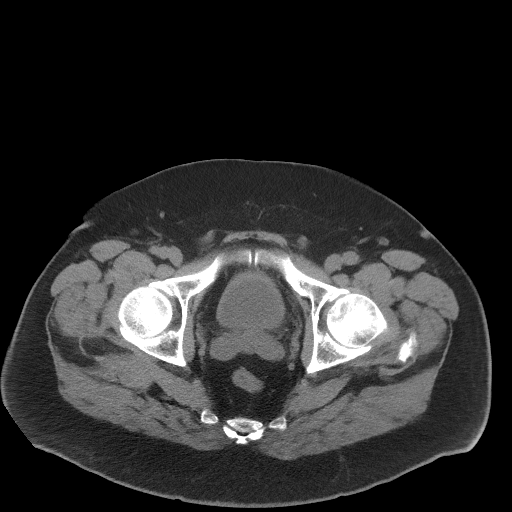
[im 31/107  soft-tissue]
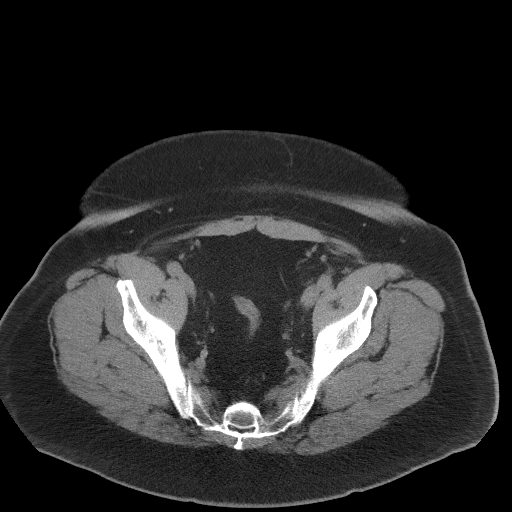
[im 36/107  soft-tissue]
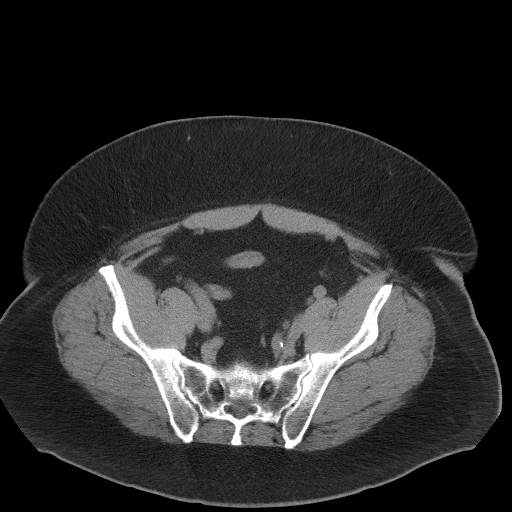
[im 45/107  soft-tissue]
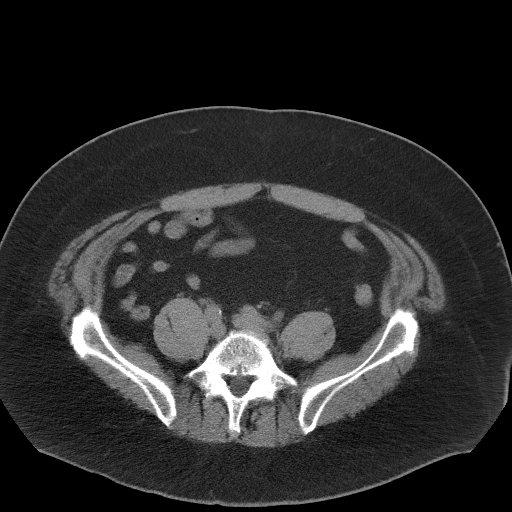
[im 54/107  soft-tissue]
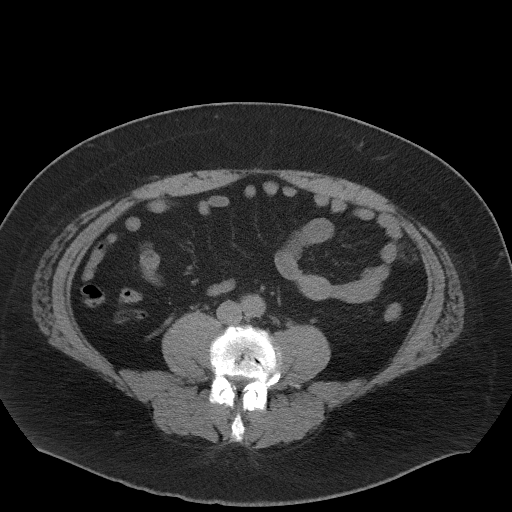
[im 62/107  soft-tissue]
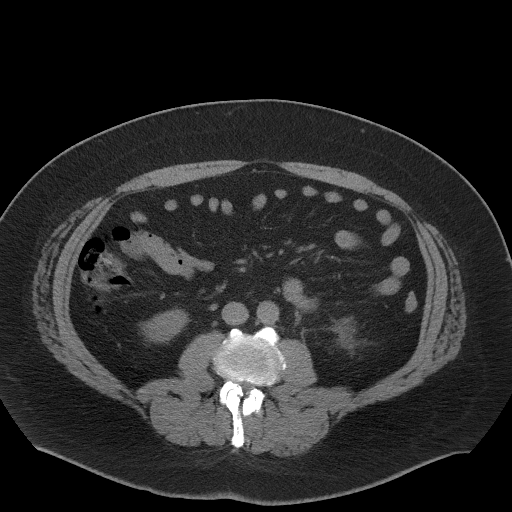
[im 71/107  soft-tissue]
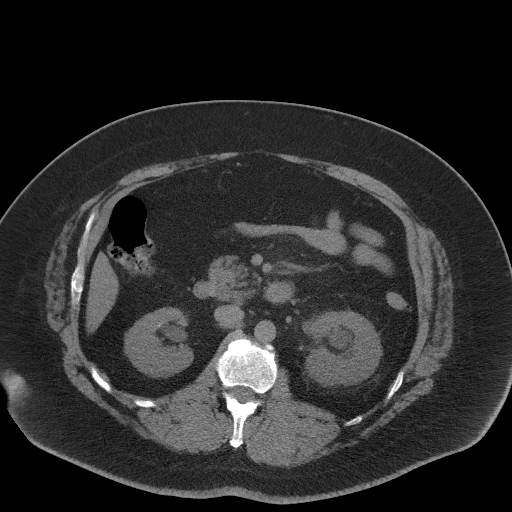
[im 71/107  bone]
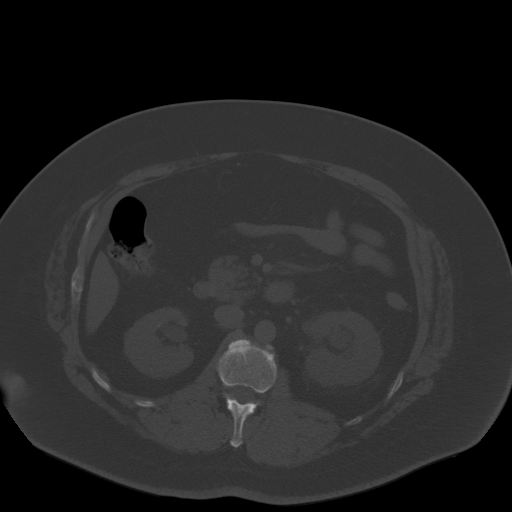
[im 76/107  soft-tissue]
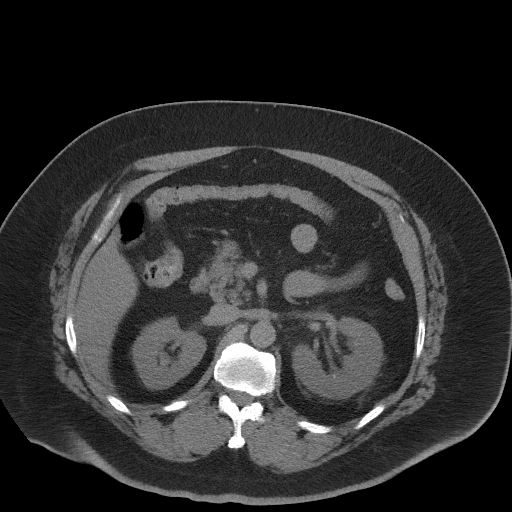
[im 84/107  soft-tissue]
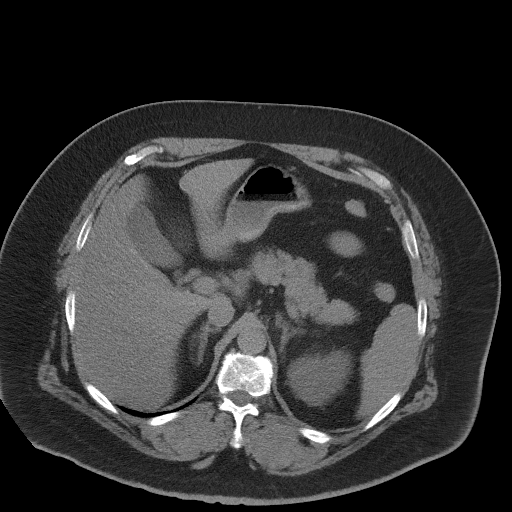
[im 93/107  soft-tissue]
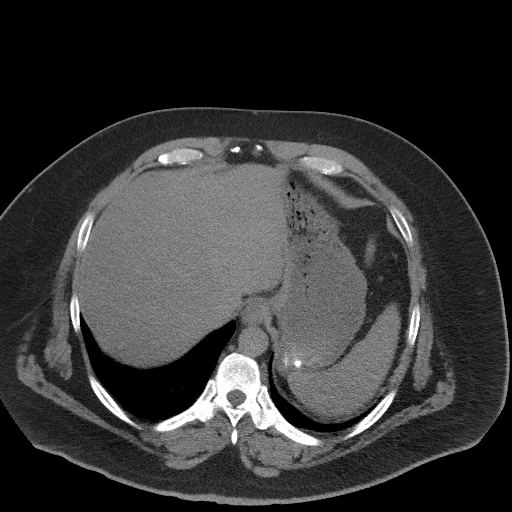
[im 102/107  soft-tissue]
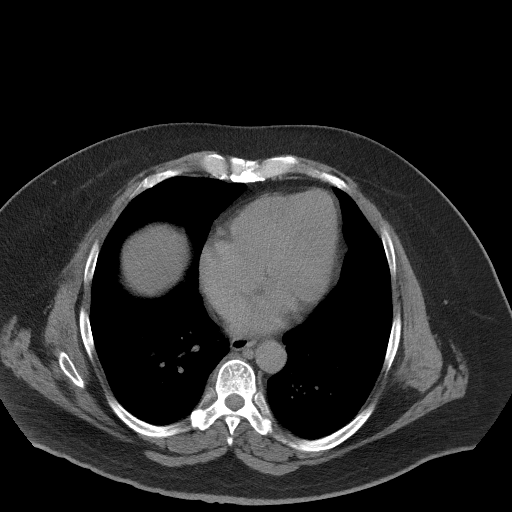

[Series 5: coronal · coronal · 0.86mm/px · 3 of 171 slices shown]
[im 57/171  soft-tissue]
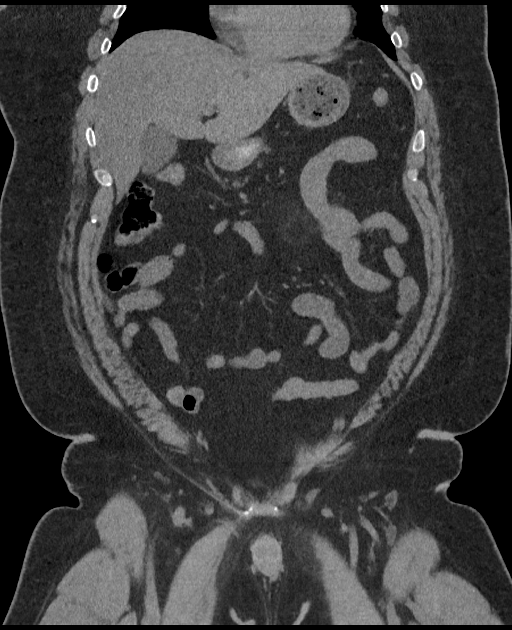
[im 76/171  soft-tissue]
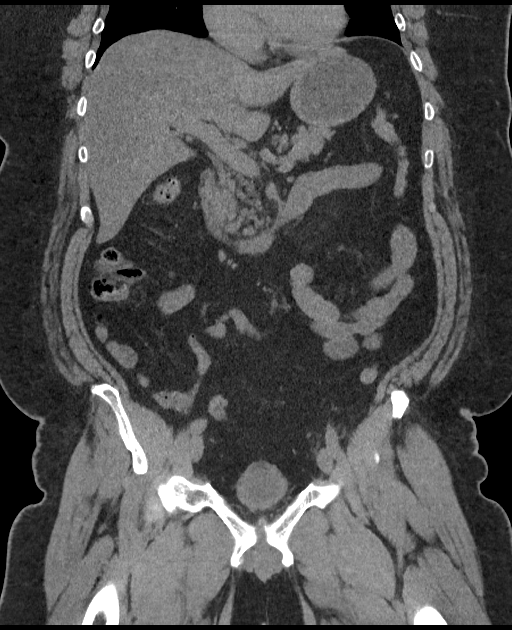
[im 95/171  soft-tissue]
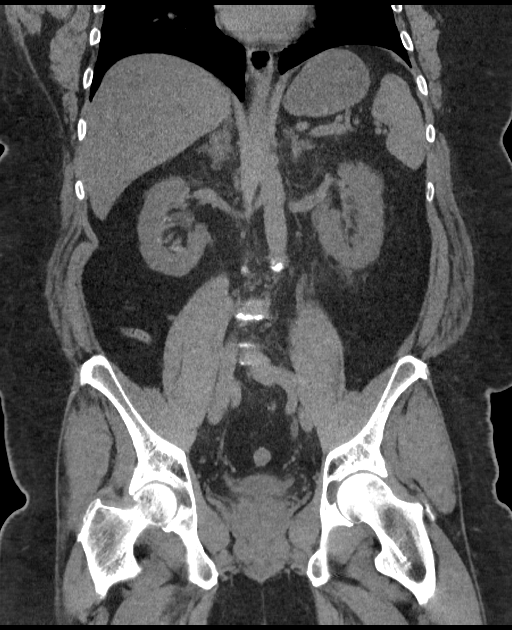

[16 of 46 positions shown; findings below may reference images not displayed]

FINDINGS: Evaluation of this exam is limited in the absence of intravenous
contrast.

Lower chest: The visualized lung bases are clear.

No intra-abdominal free air or free fluid.

Hepatobiliary: Diffuse fatty infiltration of the liver. No
intrahepatic biliary ductal dilatation. No calcified gallstone or
pericholecystic fluid.

Pancreas: Unremarkable. No pancreatic ductal dilatation or
surrounding inflammatory changes.

Spleen: Normal in size without focal abnormality.

Adrenals/Urinary Tract: The adrenal glands are unremarkable. There
is a 3 mm stone along the posterior bladder wall adjacent to the
left ureterovesical junction which may represent a recently passed
left renal calculus versus less likely a left UVJ stone. There is
mild left hydronephrosis. Multiple additional nonobstructing
bilateral renal calculi noted measuring up to 4-5 mm in the inferior
pole of the left kidney. There is no hydronephrosis on the right.
The right ureter is unremarkable.

Stomach/Bowel: There is no bowel obstruction or active inflammation.
The appendix is normal.

Vascular/Lymphatic: Mild atherosclerotic calcification of the aorta.
The IVC is unremarkable. No portal venous gas. There is no
adenopathy.

Reproductive: The prostate and seminal vesicles are grossly
unremarkable. No pelvic mass.

Other: None

Musculoskeletal: Degenerative changes of the spine. No acute osseous
pathology.
IMPRESSION: 1. A 3 mm recently passed left renal calculus versus less likely a
left UVJ stone with mild left hydronephrosis.
2. Additional nonobstructing bilateral renal calculi. There is no
hydronephrosis on the right.
3. Fatty liver.
4. No bowel obstruction or active inflammation.  Normal appendix.

## 2021-11-19 IMAGING — US US RENAL
1 series · 14 of 25 positions shown · non-contrast
Comparison: CT Abdomen and Pelvis 02/09/2019.

CLINICAL DATA: 53-year-old male with obstructing left UVJ calculus
on CT last month.

EXAM:
RENAL / URINARY TRACT ULTRASOUND COMPLETE

[Series 1: us renal · 0.28mm/px · 14 of 42 slices shown]
[im 1/42]
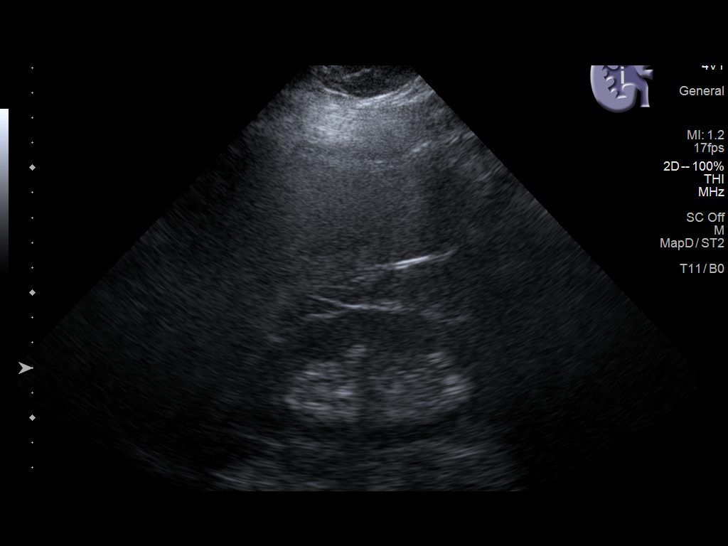
[im 4/42]
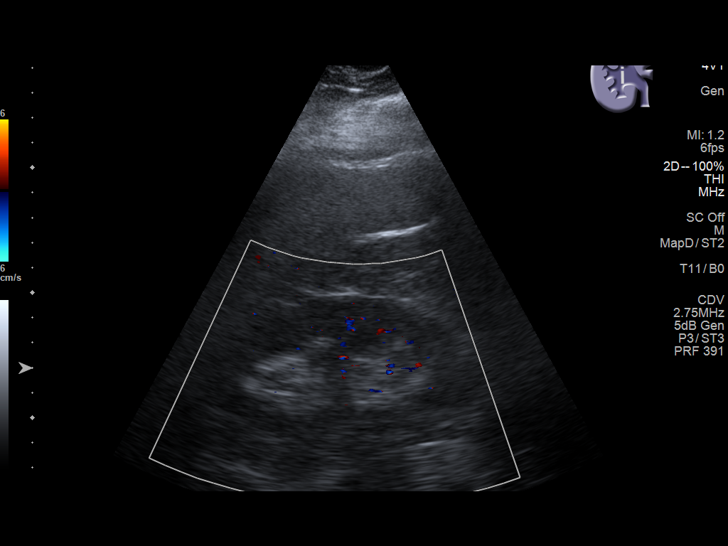
[im 7/42]
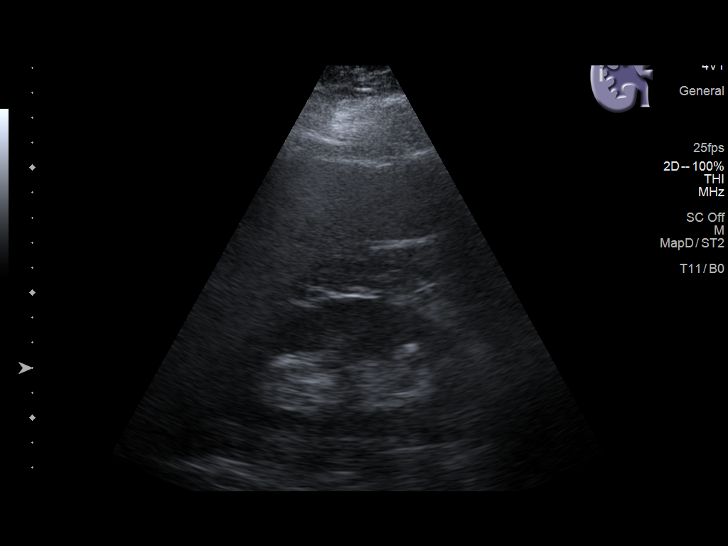
[im 11/42]
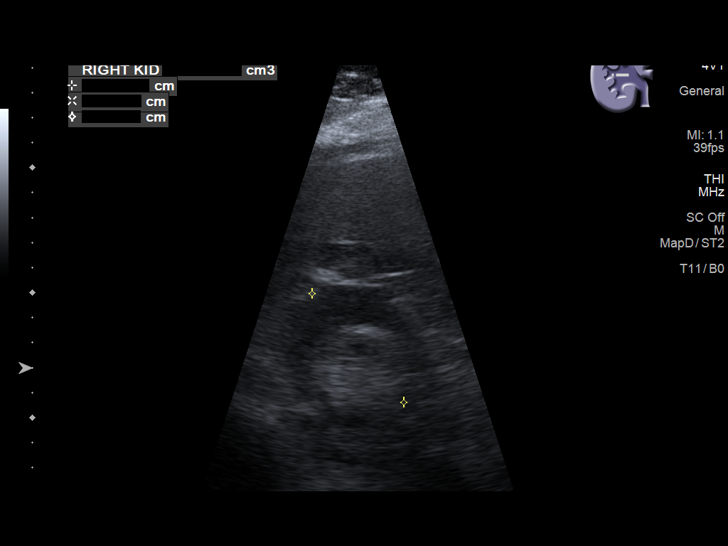
[im 14/42]
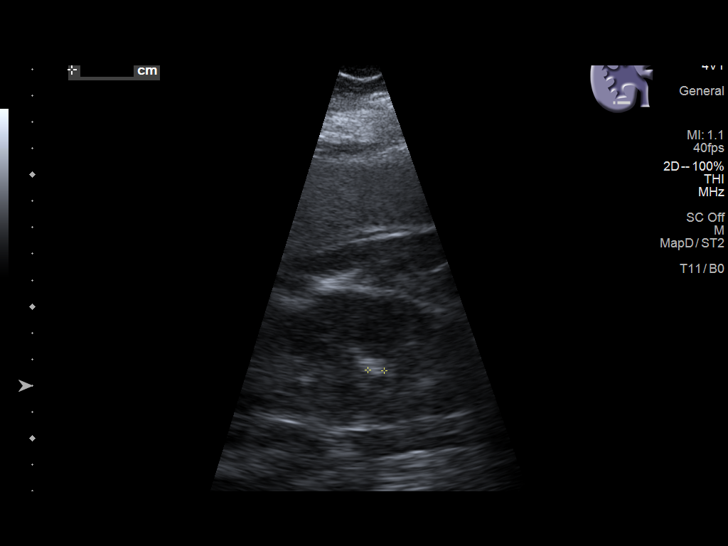
[im 16/42]
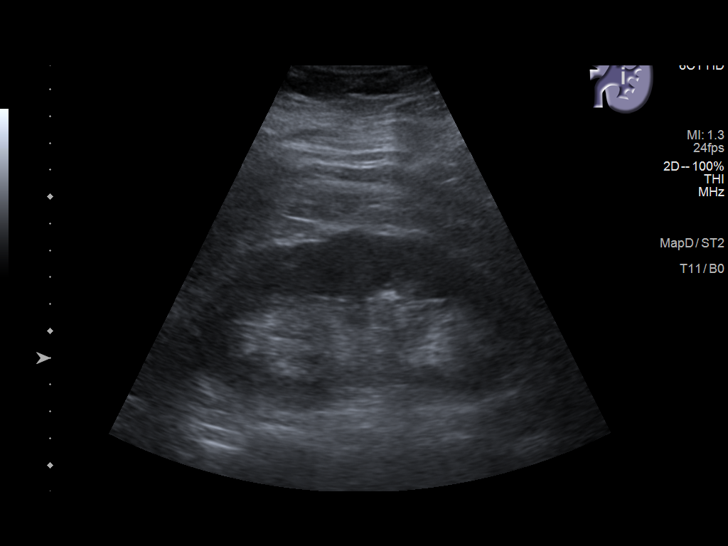
[im 19/42]
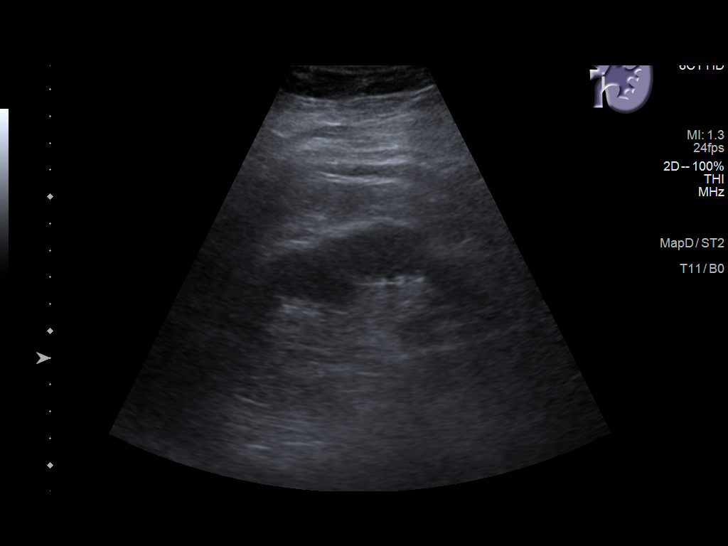
[im 23/42]
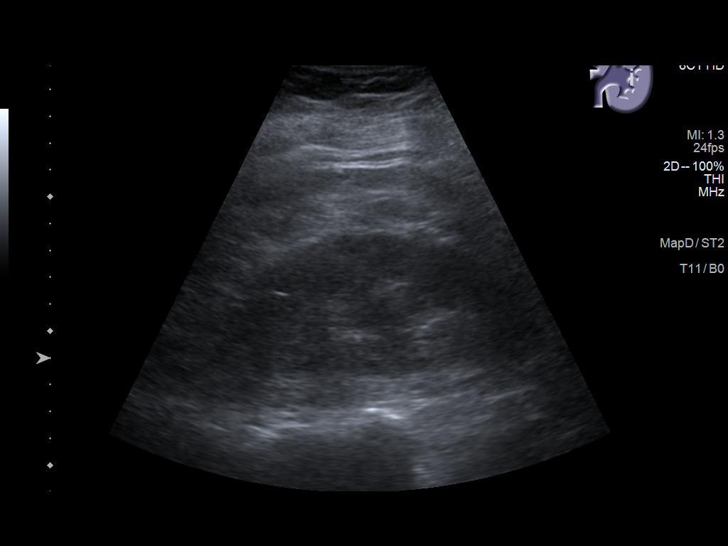
[im 26/42]
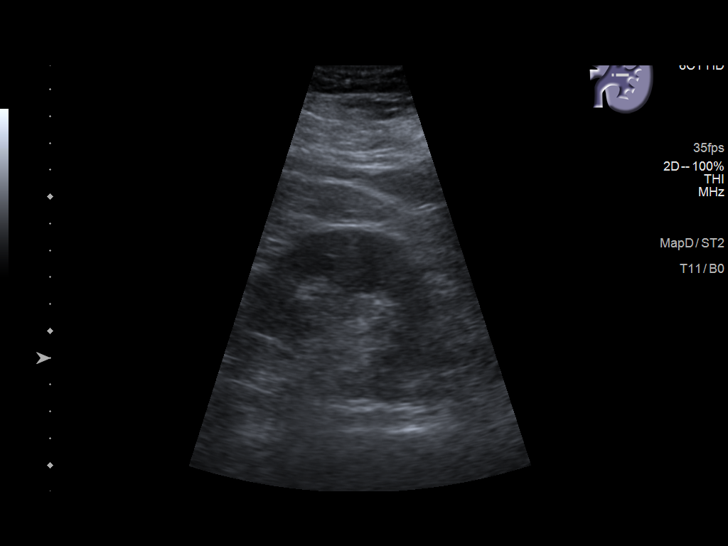
[im 28/42]
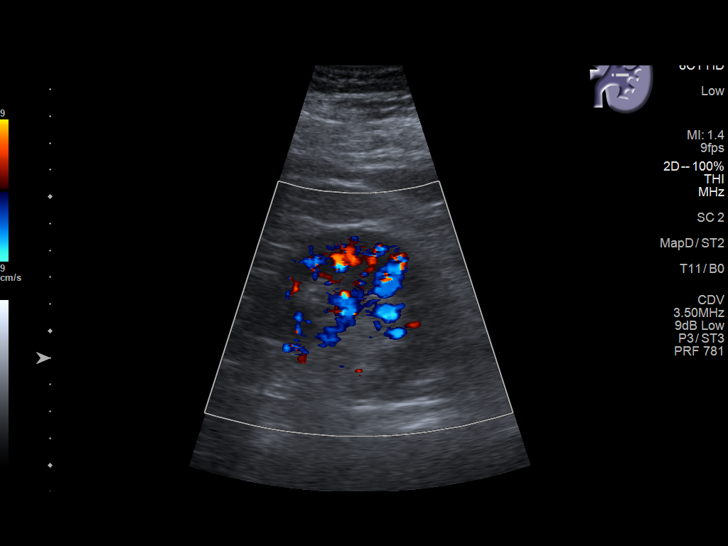
[im 31/42]
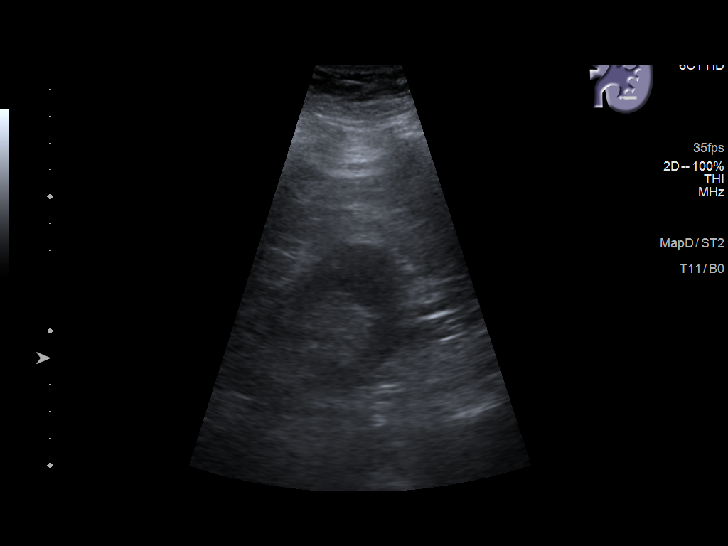
[im 35/42]
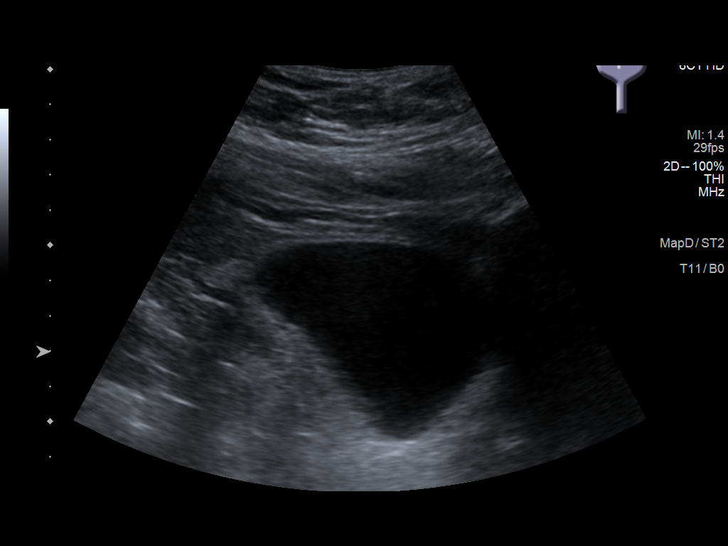
[im 38/42]
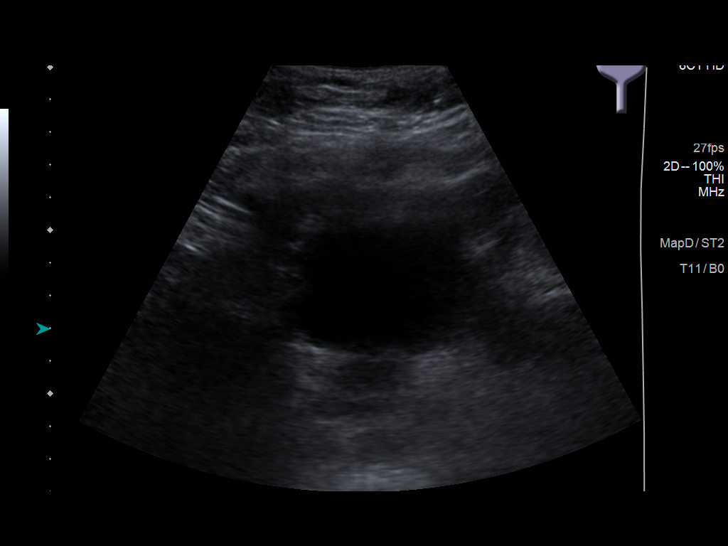
[im 42/42]
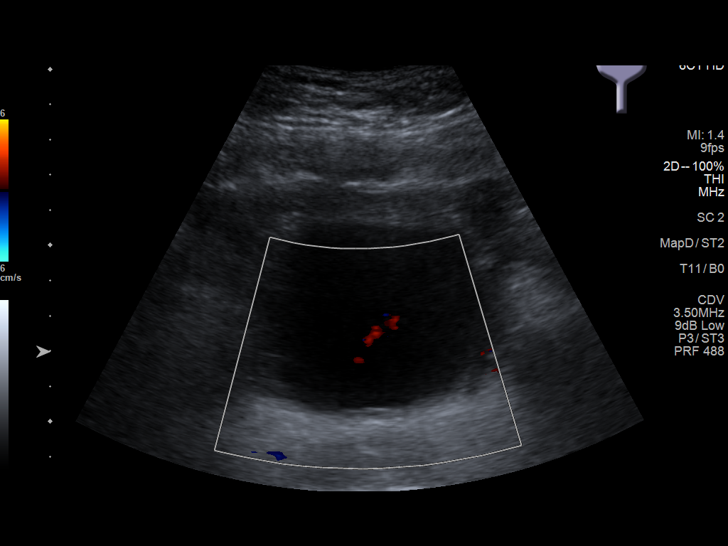

[14 of 25 positions shown; findings below may reference images not displayed]

FINDINGS: Right Kidney:

Renal measurements: 11.6 x 5.4 x 5.7 centimeters = volume: 185 mL.
No right hydronephrosis or renal lesion. Small echogenic focus in
the lower pole might correspond to some of the punctate
nephrolithiasis seen last month.

Left Kidney:

Renal measurements: 12.7 x 6.0 x 5.6 centimeters = volume: 224 mL.
Hydronephrosis appears resolved. No discrete left renal lesion
although several echogenic foci are identified likely corresponding
to the residual nephrolithiasis.

Bladder:

Appears normal for degree of bladder distention. Both ureteral jets
are detected with Doppler (left jet image 42).

Other:

Echogenic liver (image 2).
IMPRESSION: 1. Resolved left hydronephrosis since last month. Evidence of the
bilateral nephrolithiasis seen by CT.
2. Evidence of hepatic steatosis.

## 2022-01-28 ENCOUNTER — Other Ambulatory Visit: Payer: Self-pay

## 2022-01-28 DIAGNOSIS — N201 Calculus of ureter: Secondary | ICD-10-CM | POA: Insufficient documentation

## 2022-01-28 DIAGNOSIS — R109 Unspecified abdominal pain: Secondary | ICD-10-CM | POA: Diagnosis present

## 2022-01-28 DIAGNOSIS — N23 Unspecified renal colic: Secondary | ICD-10-CM | POA: Diagnosis not present

## 2022-01-28 NOTE — ED Triage Notes (Signed)
Pt c/o abd pain that is going towards the back. Pt c/o nausea and has had a couple bouts of diarrhea as well. Pain started approx 7 hrs ago.

## 2022-01-29 ENCOUNTER — Emergency Department
Admission: EM | Admit: 2022-01-29 | Discharge: 2022-01-29 | Disposition: A | Discharge status: Home / Self Care | Payer: PRIVATE HEALTH INSURANCE | Attending: Emergency Medicine | Admitting: Emergency Medicine

## 2022-01-29 ENCOUNTER — Emergency Department: Payer: PRIVATE HEALTH INSURANCE

## 2022-01-29 DIAGNOSIS — N23 Unspecified renal colic: Secondary | ICD-10-CM

## 2022-01-29 DIAGNOSIS — R109 Unspecified abdominal pain: Secondary | ICD-10-CM

## 2022-01-29 DIAGNOSIS — N201 Calculus of ureter: Secondary | ICD-10-CM

## 2022-01-29 LAB — COMPREHENSIVE METABOLIC PANEL
ALT: 49 U/L — ABNORMAL HIGH (ref 0–44)
AST: 29 U/L (ref 15–41)
Albumin: 4.6 g/dL (ref 3.5–5.0)
Alkaline Phosphatase: 65 U/L (ref 38–126)
Anion gap: 11 (ref 5–15)
BUN: 16 mg/dL (ref 6–20)
CO2: 27 mmol/L (ref 22–32)
Calcium: 9.1 mg/dL (ref 8.9–10.3)
Chloride: 104 mmol/L (ref 98–111)
Creatinine, Ser: 1.26 mg/dL — ABNORMAL HIGH (ref 0.61–1.24)
GFR, Estimated: >60 mL/min (ref >60)
Glucose, Bld: 135 mg/dL — ABNORMAL HIGH (ref 70–99)
Potassium: 3.3 mmol/L — ABNORMAL LOW (ref 3.5–5.1)
Sodium: 142 mmol/L (ref 135–145)
Total Bilirubin: 1 mg/dL (ref 0.3–1.2)
Total Protein: 7.5 g/dL (ref 6.5–8.1)

## 2022-01-29 LAB — LIPASE, BLOOD: Lipase: 32 U/L (ref 11–51)

## 2022-01-29 LAB — URINALYSIS, ROUTINE W REFLEX MICROSCOPIC
Bacteria, UA: NONE SEEN
Bilirubin Urine: NEGATIVE
Glucose, UA: NEGATIVE mg/dL
Ketones, ur: NEGATIVE mg/dL
Leukocytes,Ua: NEGATIVE
Nitrite: NEGATIVE
Protein, ur: NEGATIVE mg/dL
RBC / HPF: >50 RBC/hpf — ABNORMAL HIGH (ref 0–5)
Specific Gravity, Urine: 1.018 (ref 1.005–1.030)
WBC, UA: NONE SEEN WBC/hpf (ref 0–5)
pH: 6 (ref 5.0–8.0)

## 2022-01-29 LAB — CBC
HCT: 43.5 % (ref 39.0–52.0)
Hemoglobin: 14.2 g/dL (ref 13.0–17.0)
MCH: 28.7 pg (ref 26.0–34.0)
MCHC: 32.6 g/dL (ref 30.0–36.0)
MCV: 88.1 fL (ref 80.0–100.0)
Platelets: 249 10*3/uL (ref 150–400)
RBC: 4.94 MIL/uL (ref 4.22–5.81)
RDW: 12.9 % (ref 11.5–15.5)
WBC: 15 10*3/uL — ABNORMAL HIGH (ref 4.0–10.5)
nRBC: 0 % (ref 0.0–0.2)

## 2022-01-29 MED ORDER — ONDANSETRON 4 MG PO TBDP: ORAL_TABLET | ORAL | 0 refills | Status: DC

## 2022-01-29 MED ORDER — KETOROLAC TROMETHAMINE 30 MG/ML IJ SOLN
15.0000 mg | Freq: Once | INTRAMUSCULAR | Status: AC
  Administered 2022-01-29: 15 mg via INTRAVENOUS
  Filled 2022-01-29: qty 1

## 2022-01-29 MED ORDER — TAMSULOSIN HCL 0.4 MG PO CAPS: ORAL_CAPSULE | ORAL | 0 refills | Status: DC

## 2022-01-29 MED ORDER — OXYCODONE-ACETAMINOPHEN 5-325 MG PO TABS: 2.0000 | ORAL_TABLET | Freq: Four times a day (QID) | ORAL | 0 refills | Status: DC | PRN

## 2022-01-29 MED ORDER — MORPHINE SULFATE (PF) 4 MG/ML IV SOLN
4.0000 mg | Freq: Once | INTRAVENOUS | Status: AC
  Administered 2022-01-29: 4 mg via INTRAVENOUS
  Filled 2022-01-29: qty 1

## 2022-01-29 MED ORDER — DOCUSATE SODIUM 100 MG PO CAPS: ORAL_CAPSULE | ORAL | 0 refills | Status: DC

## 2022-01-29 MED ORDER — ONDANSETRON HCL 4 MG/2ML IJ SOLN
4.0000 mg | INTRAMUSCULAR | Status: AC
  Administered 2022-01-29: 4 mg via INTRAVENOUS
  Filled 2022-01-29: qty 2

## 2022-01-29 NOTE — Discharge Instructions (Addendum)
You have been seen in the Emergency Department (ED) today for pain caused by kidney stones.  As we have discussed, please drink plenty of fluids.  Please make a follow up appointment with the physician(s) listed elsewhere in this documentation.  You may take pain medication as needed but ONLY as prescribed.  Please also take your prescribed Flomax daily.  If you are going to follow up with Urology for possible lithotripsy, please do not take any ibuprofen, naproxen, aspirin, Toradol, or other NSAID after Tuesday morning, as this may exclude you from lithotripsy.  Please see your doctor as soon as possible as stones may take 1-3 weeks to pass and you may require additional care or medications.  Do not drink alcohol, drive or participate in any other potentially dangerous activities while taking opiate pain medication as it may make you sleepy. Do not take this medication with any other sedating medications, either prescription or over-the-counter. If you were prescribed Percocet or Vicodin, do not take these with acetaminophen (Tylenol) as it is already contained within these medications.   Take Percocet as needed for severe pain.  This medication is an opiate (or narcotic) pain medication and can be habit forming.  Use it as little as possible to achieve adequate pain control.  Do not use or use it with extreme caution if you have a history of opiate abuse or dependence.  If you are on a pain contract with your primary care doctor or a pain specialist, be sure to let them know you were prescribed this medication today from the [facility].  This medication is intended for your use only - do not give any to anyone else and keep it in a secure place where nobody else, especially children, have access to it.  It will also cause or worsen constipation, so you may want to consider taking an over-the-counter stool softener while you are taking this medication.  Return to the Emergency  Department (ED) or call your doctor if you have any worsening pain, fever, painful urination, are unable to urinate, or develop other symptoms that concern you.  

## 2022-01-29 NOTE — ED Provider Notes (Signed)
[facility] Provider Note    Event Date/Time   First MD Initiated Contact with Patient 01/29/22 [phone]     (approximate)   History   Abdominal Pain   HPI  Scott Knox is a 57 y.o. male who presents for evaluation of acute onset pain in the right side of his abdomen radiating towards his back.  He said it feels somewhat like prior kidney stones, but the pain persisted for longer and he had an episode of diarrhea which is unusual for him and made him concerned something else might be going on.  He had nausea and at least 1 episode of vomiting.  The pain is somewhat intermittent but it has been relatively persistent although it is gotten better over the last few hours.  He has had multiple stones in the past although he has never required surgical intervention.  He has had no recent fever, chest pain, shortness of breath, dysuria, nor hematuria.     Physical Exam   Triage Vital Signs: ED Triage Vitals  Enc Vitals Group     BP 01/28/22 2218 (!) 157/92     Pulse Rate 01/28/22 2218 86     Resp 01/28/22 2218 18     Temp 01/28/22 2218 97.9 F (36.6 C)     Temp Source 01/28/22 2218 Oral     SpO2 01/28/22 2218 98 %     Weight 01/28/22 2217 127 kg (280 lb)     Height 01/28/22 2217 1.778 m (5\' 10" )     Head Circumference --      Peak Flow --      Pain Score 01/28/22 2224 10     Pain Loc --      Pain Edu? --      Excl. in [place]? --     Most recent vital signs: Vitals:   01/29/22 0500 01/29/22 0526  BP: (!) 146/90   Pulse: 75   Resp: 18 18  Temp:    SpO2: 95% 95%     General: Awake, appears uncomfortable but not in severe distress. CV:  Good peripheral perfusion.  Regular rate and rhythm Resp:  Normal effort.  Abd:  Morbid obesity.  No tenderness to palpation or rebound or guarding.  He has a little bit of tenderness to the lateral right and some right flank tenderness to percussion, but no abdominal tenderness to palpation. Other:  Ambulatory, no  significant distress at this time.   ED Results / Procedures / Treatments   Labs (all labs ordered are listed, but only abnormal results are displayed) Labs Reviewed  COMPREHENSIVE METABOLIC PANEL - Abnormal; Notable for the following components:      Result Value   Potassium 3.3 (*)    Glucose, Bld 135 (*)    Creatinine, Ser 1.26 (*)    ALT 49 (*)    All other components within normal limits  CBC - Abnormal; Notable for the following components:   WBC 15.0 (*)    All other components within normal limits  URINALYSIS, ROUTINE W REFLEX MICROSCOPIC - Abnormal; Notable for the following components:   Color, Urine YELLOW (*)    APPearance CLEAR (*)    Hgb urine dipstick MODERATE (*)    RBC / HPF >50 (*)    All other components within normal limits  LIPASE, BLOOD     RADIOLOGY I viewed and interpreted the patient's CT renal stone study.  He has a small stone in the right ureter.  Radiology report confirms 2 x 3 x 3 mm UPJ stone on the right side with some perinephric edema.    PROCEDURES:  Critical Care performed: No  Procedures   MEDICATIONS ORDERED IN ED: Medications  morphine (PF) 4 MG/ML injection 4 mg (4 mg Intravenous Given 01/29/22 0419)  ondansetron (ZOFRAN) injection 4 mg (4 mg Intravenous Given 01/29/22 0419)  ketorolac (TORADOL) 30 MG/ML injection 15 mg (15 mg Intravenous Given 01/29/22 0419)     IMPRESSION / MDM / ASSESSMENT AND PLAN / ED COURSE  I reviewed the triage vital signs and the nursing notes.                              Differential diagnosis includes, but is not limited to, renal/ureteral colic, UTI/pyelonephritis, diverticulitis, SBO/ileus.  Patient's presentation is most consistent with acute presentation with potential threat to life or bodily function.  Lab/studies ordered: CBC, CMP, urinalysis, lipase, CT renal stone study.  Labs are reassuring.  He has a leukocytosis of 15 which is nonspecific.  Slight elevation of his creatinine but he is  tolerating oral fluids.  He has hematuria without sign of infection on his urinalysis.  Labs/interventions ordered: Morphine 4 mg IV, Zofran 4 mg IV, Toradol 15 mg IV.  As document above, CT renal stone study shows a relatively small UPJ stone.  I had my usual and customary kidney stone discussion with the patient.  His pain is well-controlled and he is fine with the plan for discharge and outpatient follow-up.  I reviewed the [place] controlled substance database and verified there are no concerning prescribing habits.  Prescriptions as listed below.  I gave my usual customary follow-up recommendations and return precautions.        FINAL CLINICAL IMPRESSION(S) / ED DIAGNOSES   Final diagnoses:  Acute right flank pain  Ureteral stone  Renal colic     Rx / DC Orders   ED Discharge Orders          Ordered    oxyCODONE-acetaminophen (PERCOCET) 5-325 MG tablet  Every 6 hours PRN        01/29/22 0620    ondansetron (ZOFRAN-ODT) 4 MG disintegrating tablet        01/29/22 0620    tamsulosin (FLOMAX) 0.4 MG CAPS capsule        01/29/22 0620    docusate sodium (COLACE) 100 MG capsule        01/29/22 0620             Note:  This document was prepared using Dragon voice recognition software and may include unintentional dictation errors.   Loleta Rose, MD 01/29/22 [phone]

## 2022-11-17 ENCOUNTER — Other Ambulatory Visit: Payer: Self-pay | Admitting: Physician Assistant

## 2022-11-17 DIAGNOSIS — M5416 Radiculopathy, lumbar region: Secondary | ICD-10-CM

## 2022-11-17 DIAGNOSIS — R202 Paresthesia of skin: Secondary | ICD-10-CM

## 2022-11-18 ENCOUNTER — Ambulatory Visit
Admission: RE | Admit: 2022-11-18 | Discharge: 2022-11-18 | Disposition: A | Discharge status: Home / Self Care | Payer: No Typology Code available for payment source | Source: Ambulatory Visit | Attending: Physician Assistant | Admitting: Physician Assistant

## 2022-11-18 DIAGNOSIS — M5416 Radiculopathy, lumbar region: Secondary | ICD-10-CM

## 2022-11-18 DIAGNOSIS — R202 Paresthesia of skin: Secondary | ICD-10-CM

## 2022-12-09 HISTORY — PX: LUMBAR EPIDURAL INJECTION: SHX1980

## 2023-02-13 ENCOUNTER — Emergency Department: Payer: No Typology Code available for payment source

## 2023-02-13 ENCOUNTER — Emergency Department
Admission: EM | Admit: 2023-02-13 | Discharge: 2023-02-13 | Disposition: A | Discharge status: Home / Self Care | Payer: No Typology Code available for payment source | Attending: Emergency Medicine | Admitting: Emergency Medicine

## 2023-02-13 ENCOUNTER — Other Ambulatory Visit: Payer: Self-pay

## 2023-02-13 DIAGNOSIS — N132 Hydronephrosis with renal and ureteral calculous obstruction: Secondary | ICD-10-CM | POA: Diagnosis not present

## 2023-02-13 DIAGNOSIS — D72829 Elevated white blood cell count, unspecified: Secondary | ICD-10-CM | POA: Insufficient documentation

## 2023-02-13 DIAGNOSIS — R1031 Right lower quadrant pain: Secondary | ICD-10-CM | POA: Diagnosis present

## 2023-02-13 DIAGNOSIS — N200 Calculus of kidney: Secondary | ICD-10-CM

## 2023-02-13 HISTORY — DX: Calculus of kidney: N20.0

## 2023-02-13 LAB — URINALYSIS, ROUTINE W REFLEX MICROSCOPIC
Bacteria, UA: NONE SEEN
Bilirubin Urine: NEGATIVE
Glucose, UA: NEGATIVE mg/dL
Ketones, ur: 5 mg/dL — AB
Leukocytes,Ua: NEGATIVE
Nitrite: NEGATIVE
Protein, ur: NEGATIVE mg/dL
RBC / HPF: >50 RBC/hpf (ref 0–5)
Specific Gravity, Urine: 1.021 (ref 1.005–1.030)
Squamous Epithelial / HPF: 0 /[HPF] (ref 0–5)
pH: 5 (ref 5.0–8.0)

## 2023-02-13 LAB — BASIC METABOLIC PANEL
Anion gap: 11 (ref 5–15)
BUN: 14 mg/dL (ref 6–20)
CO2: 25 mmol/L (ref 22–32)
Calcium: 9 mg/dL (ref 8.9–10.3)
Chloride: 103 mmol/L (ref 98–111)
Creatinine, Ser: 1.13 mg/dL (ref 0.61–1.24)
GFR, Estimated: >60 mL/min (ref >60)
Glucose, Bld: 112 mg/dL — ABNORMAL HIGH (ref 70–99)
Potassium: 3.3 mmol/L — ABNORMAL LOW (ref 3.5–5.1)
Sodium: 139 mmol/L (ref 135–145)

## 2023-02-13 LAB — CBC WITH DIFFERENTIAL/PLATELET
Abs Immature Granulocytes: 0.06 10*3/uL (ref 0.00–0.07)
Basophils Absolute: 0.1 10*3/uL (ref 0.0–0.1)
Basophils Relative: 0 %
Eosinophils Absolute: 0 10*3/uL (ref 0.0–0.5)
Eosinophils Relative: 0 %
HCT: 43.1 % (ref 39.0–52.0)
Hemoglobin: 14.2 g/dL (ref 13.0–17.0)
Immature Granulocytes: 0 %
Lymphocytes Relative: 6 %
Lymphs Abs: 0.9 10*3/uL (ref 0.7–4.0)
MCH: 29.3 pg (ref 26.0–34.0)
MCHC: 32.9 g/dL (ref 30.0–36.0)
MCV: 89 fL (ref 80.0–100.0)
Monocytes Absolute: 1.1 10*3/uL — ABNORMAL HIGH (ref 0.1–1.0)
Monocytes Relative: 8 %
Neutro Abs: 12.6 10*3/uL — ABNORMAL HIGH (ref 1.7–7.7)
Neutrophils Relative %: 86 %
Platelets: 207 10*3/uL (ref 150–400)
RBC: 4.84 MIL/uL (ref 4.22–5.81)
RDW: 13.5 % (ref 11.5–15.5)
WBC: 14.8 10*3/uL — ABNORMAL HIGH (ref 4.0–10.5)
nRBC: 0 % (ref 0.0–0.2)

## 2023-02-13 MED ORDER — KETOROLAC TROMETHAMINE 60 MG/2ML IM SOLN
30.0000 mg | Freq: Once | INTRAMUSCULAR | Status: AC
  Administered 2023-02-13: 30 mg via INTRAMUSCULAR
  Filled 2023-02-13: qty 2

## 2023-02-13 MED ORDER — TAMSULOSIN HCL 0.4 MG PO CAPS: 0.4000 mg | ORAL_CAPSULE | Freq: Every day | ORAL | 0 refills | Status: AC

## 2023-02-13 NOTE — Discharge Instructions (Signed)
Please follow-up with urology as needed.  You can continue to take the tamsulosin over the next couple of days until you feel like you have passed the stone.

## 2023-02-13 NOTE — ED Provider Notes (Signed)
[facility] Provider Note    Event Date/Time   First MD Initiated Contact with Patient 02/13/23 2024     (approximate)   History   Abdominal Pain   HPI Scott Knox is a 59 y.o. male with history of kidney stones presenting today for right-sided abdominal pain.  Patient states onset of right lower quadrant and right flank pain 24 hours ago.  Had been steadily worsening.  Mild nausea but no vomiting.  Did note blood in his urine but no other burning sensation.  Denies fever, chills, chest pain, other abdominal pain.  Has never needed procedures in the past for kidney stones.  No other abdominal surgeries.     Physical Exam   Triage Vital Signs: ED Triage Vitals  Encounter Vitals Group     BP 02/13/23 1527 138/75     Systolic BP Percentile --      Diastolic BP Percentile --      Pulse Rate 02/13/23 1527 90     Resp 02/13/23 1527 18     Temp 02/13/23 1527 98.1 F (36.7 C)     Temp Source 02/13/23 1527 Oral     SpO2 02/13/23 1527 92 %     Weight 02/13/23 1528 285 lb (129.3 kg)     Height 02/13/23 1528 5\' 10"  (1.778 m)     Head Circumference --      Peak Flow --      Pain Score 02/13/23 1528 5     Pain Loc --      Pain Education --      Exclude from Growth Chart --     Most recent vital signs: Vitals:   02/13/23 1527 02/13/23 2106  BP: 138/75 127/79  Pulse: 90 80  Resp: 18 18  Temp: 98.1 F (36.7 C)   SpO2: 92% 97%   Physical Exam: I have reviewed the vital signs and nursing notes. General: Awake, alert, no acute distress.  Nontoxic appearing. Head:  Atraumatic, normocephalic.   ENT:  EOM intact, PERRL. Oral mucosa is pink and moist with no lesions. Neck: Neck is supple with full range of motion, No meningeal signs. Cardiovascular:  RRR, No murmurs. Peripheral pulses palpable and equal bilaterally. Respiratory:  Symmetrical chest wall expansion.  No rhonchi, rales, or wheezes.  Good air movement throughout.  No use of accessory  muscles.   Musculoskeletal:  No cyanosis or edema. Moving extremities with full ROM Abdomen:  Soft, nontender, nondistended. Neuro:  GCS 15, moving all four extremities, interacting appropriately. Speech clear. Psych:  Calm, appropriate.   Skin:  Warm, dry, no rash.    ED Results / Procedures / Treatments   Labs (all labs ordered are listed, but only abnormal results are displayed) Labs Reviewed  BASIC METABOLIC PANEL - Abnormal; Notable for the following components:      Result Value   Potassium 3.3 (*)    Glucose, Bld 112 (*)    All other components within normal limits  CBC WITH DIFFERENTIAL/PLATELET - Abnormal; Notable for the following components:   WBC 14.8 (*)    Neutro Abs 12.6 (*)    Monocytes Absolute 1.1 (*)    All other components within normal limits  URINALYSIS, ROUTINE W REFLEX MICROSCOPIC - Abnormal; Notable for the following components:   Color, Urine YELLOW (*)    APPearance HAZY (*)    Hgb urine dipstick LARGE (*)    Ketones, ur 5 (*)    All other components within normal  limits     EKG    RADIOLOGY Independently interpreted CT imaging with evidence of kidney stone within the bladder but nothing currently in either ureter.   PROCEDURES:  Critical Care performed: No  Procedures   MEDICATIONS ORDERED IN ED: Medications  ketorolac (TORADOL) injection 30 mg (30 mg Intramuscular Given 02/13/23 2105)     IMPRESSION / MDM / ASSESSMENT AND PLAN / ED COURSE  I reviewed the triage vital signs and the nursing notes.                              Differential diagnosis includes, but is not limited to, nephrolithiasis, pyelonephritis, acute cystitis, appendicitis  Patient's presentation is most consistent with acute complicated illness / injury requiring diagnostic workup.  Patient is a 58 year old male presenting today for right sided abdominal pain with history of kidney stones.  Vital signs are stable.  By the time of my exam, patient was no longer  having pain in his right side of his abdomen.  Was noting some pain with urination along with blood.  Laboratory workup shows slight leukocytosis but UA shows no evidence of infection and blood present.  Kidney function normal.  CT imaging shows evidence of stone within the bladder but none currently in the ureter.  He states pain symptoms did resolve or improve slightly before the CT scan.  I suspect that he did pass the stone while here in the ED.  Given 1 shot of Toradol IM to help with ongoing pain symptoms while he continues to pass the stone through the urethra.  Told to follow-up with urology for ongoing outpatient management given recurrence of kidney stones.     FINAL CLINICAL IMPRESSION(S) / ED DIAGNOSES   Final diagnoses:  Nephrolithiasis     Rx / DC Orders   ED Discharge Orders          Ordered    Ambulatory referral to Urology       Comments: Recurrent kidney stones   02/13/23 2058             Note:  This document was prepared using Dragon voice recognition software and may include unintentional dictation errors.   Janith Lima, MD 02/13/23 2117

## 2023-02-13 NOTE — ED Provider Triage Note (Signed)
Emergency Medicine Provider Triage Evaluation Note  Scott Knox , a 58 y.o. male  was evaluated in triage.  Pt complains of right sided flank pain, reports history of kidney stones and feels the same. Mild dysuria, unsure of hematuria +nausea.  Review of Systems  Positive: Right flank pain Negative: fever  Physical Exam  BP 138/75 (BP Location: Left Arm)   Pulse 90   Temp 98.1 F (36.7 C) (Oral)   Resp 18   Ht 5\' 10"  (1.778 m)   Wt 129.3 kg   SpO2 92%   BMI 40.89 kg/m  Gen:   Awake, no distress   Resp:  Normal effort  MSK:   Moves extremities without difficulty  Other:    Medical Decision Making  Medically screening exam initiated at 3:28 PM.  Appropriate orders placed.  Scott Knox was informed that the remainder of the evaluation will be completed by another provider, this initial triage assessment does not replace that evaluation, and the importance of remaining in the ED until their evaluation is complete.     Jackelyn Hoehn, PA-C 02/13/23 1533

## 2023-02-13 NOTE — ED Triage Notes (Signed)
Pt sts that he has a hx of kidney stones. Pt sts that he has the same feeling type of pain in his flank. Pt has passed two kidney stones in the last two weeks while at work.

## 2023-02-23 ENCOUNTER — Ambulatory Visit (INDEPENDENT_AMBULATORY_CARE_PROVIDER_SITE_OTHER): Payer: No Typology Code available for payment source | Admitting: Urology

## 2023-02-23 ENCOUNTER — Encounter: Payer: Self-pay | Admitting: Urology

## 2023-02-23 ENCOUNTER — Ambulatory Visit (HOSPITAL_BASED_OUTPATIENT_CLINIC_OR_DEPARTMENT_OTHER)
Admission: RE | Admit: 2023-02-23 | Discharge: 2023-02-23 | Disposition: A | Discharge status: Home / Self Care | Payer: No Typology Code available for payment source | Source: Ambulatory Visit | Attending: Urology | Admitting: Urology

## 2023-02-23 VITALS — BP 145/87 | HR 71 | Ht 70.5 in | Wt 285.0 lb

## 2023-02-23 DIAGNOSIS — N201 Calculus of ureter: Secondary | ICD-10-CM

## 2023-02-23 DIAGNOSIS — N200 Calculus of kidney: Secondary | ICD-10-CM | POA: Insufficient documentation

## 2023-02-23 NOTE — Progress Notes (Signed)
Assessment: 1. Ureteral calculus   2. Nephrolithiasis     Plan: I personally reviewed the patient's chart including provider notes, lab and imaging results. I personally reviewed the CT study from 02/13/2023 which shows the 5 mm calculus in the bladder near the right UVJ consistent with a recently passed stone and bilateral nephrolithiasis. Recommend further evaluation with stone analysis and 24-hour urine due to recurrent nephrolithiasis. KUB today. Kidney stone prevention discussed with the patient and information provided. Will contact him with results of above studies when available.   Chief Complaint:  Chief Complaint  Patient presents with   Nephrolithiasis    History of Present Illness:  Scott Knox is a 58 y.o. male who is seen in consultation from [place], [place], MD for evaluation of ureteral calculus and nephrolithiasis.  He reports a history of nephrolithiasis dating back to 03-13-19.  He has passed multiple stones over the past few years.  His most recent episode was in January 2025.  He had right sided flank pain.  His pain was severe enough that he presented to the emergency room for evaluation.  CT imaging from 02/13/2023 showed a 5 mm calculus in the bladder at the right UVJ with mild to moderate right hydronephrosis and numerous bilateral nonobstructing renal calculi.  He reported passing that stone approximately 2 days later.  He is not having any pain at the present time.  No dysuria or gross hematuria.  No prior stone analysis.  He has not required surgical intervention for his kidney stones.   Past Medical History:  Past Medical History:  Diagnosis Date   Acid reflux    Hypertension    Kidney stones     Past Surgical History:  Past Surgical History:  Procedure Laterality Date   KNEE ARTHROSCOPY W/ MENISCAL REPAIR      Allergies:  Allergies  Allergen Reactions   Bee Venom Anaphylaxis    Family History:  No family history on file.  Social History:   Social History   Tobacco Use   Smoking status: Never   Smokeless tobacco: Never  Substance Use Topics   Alcohol use: Not Currently   Drug use: No    Review of symptoms:  Constitutional:  Negative for unexplained weight loss, night sweats, fever, chills ENT:  Negative for nose bleeds, sinus pain, painful swallowing CV:  Negative for chest pain, shortness of breath, exercise intolerance, palpitations, loss of consciousness Resp:  Negative for cough, wheezing, shortness of breath GI:  Negative for nausea, vomiting, diarrhea, bloody stools GU:  Positives noted in HPI; otherwise negative for gross hematuria, dysuria, urinary incontinence Neuro:  Negative for seizures, poor balance, limb weakness, slurred speech Psych:  Negative for lack of energy, depression, anxiety Endocrine:  Negative for polydipsia, polyuria, symptoms of hypoglycemia (dizziness, hunger, sweating) Hematologic:  Negative for anemia, purpura, petechia, prolonged or excessive bleeding, use of anticoagulants  Allergic:  Negative for difficulty breathing or choking as a result of exposure to anything; no shellfish allergy; no allergic response (rash/itch) to materials, foods  Physical exam: BP (!) 145/87   Pulse 71   Ht 5' 10.5" (1.791 m)   Wt 285 lb (129.3 kg)   BMI 40.32 kg/m  GENERAL APPEARANCE:  Well appearing, well developed, well nourished, NAD HEENT: Atraumatic, Normocephalic, oropharynx clear. NECK: Supple without lymphadenopathy or thyromegaly. LUNGS: Clear to auscultation bilaterally. HEART: Regular Rate and Rhythm without murmurs, gallops, or rubs. ABDOMEN: Soft, non-tender, No Masses. EXTREMITIES: Moves all extremities well.  Without clubbing, cyanosis, or  edema. NEUROLOGIC:  Alert and oriented x 3, normal gait, CN II-XII grossly intact.  MENTAL STATUS:  Appropriate. BACK:  Non-tender to palpation.  No CVAT SKIN:  Warm, dry and intact.    Results: U/A: 0-5 WBCs, 3-10 RBCs

## 2023-02-28 LAB — CALCULI, WITH PHOTOGRAPH (CLINICAL LAB)
Calcium Oxalate Dihydrate: 20 %
Calcium Oxalate Monohydrate: 75 %
Hydroxyapatite: 5 %
Weight Calculi: 35 mg

## 2023-03-01 ENCOUNTER — Encounter: Payer: Self-pay | Admitting: Urology

## 2023-03-03 LAB — URINALYSIS, ROUTINE W REFLEX MICROSCOPIC
Bilirubin, UA: NEGATIVE
Glucose, UA: NEGATIVE
Ketones, UA: NEGATIVE
Leukocytes,UA: NEGATIVE
Nitrite, UA: NEGATIVE
Protein,UA: NEGATIVE
Specific Gravity, UA: 1.02 (ref 1.005–1.030)
Urobilinogen, Ur: 0.2 mg/dL (ref 0.2–1.0)
pH, UA: 7 (ref 5.0–7.5)

## 2023-03-03 LAB — MICROSCOPIC EXAMINATION: Bacteria, UA: NONE SEEN

## 2023-03-06 ENCOUNTER — Other Ambulatory Visit: Payer: Self-pay | Admitting: Urology

## 2023-03-10 LAB — LITHOLINK 24HR URINE PANEL
Ammonium, Urine: 24 mmol/(24.h) (ref 15–60)
Calcium Oxalate Saturation: 7.86 (ref 6.00–10.00)
Calcium Phosphate Saturation: 0.95 (ref 0.50–2.00)
Calcium, Urine: 156 mg/(24.h) (ref <250)
Calcium/Creatinine Ratio: 111 mg/g{creat} (ref 34–196)
Calcium/Kg Body Weight: 1.2 mg/kg/d (ref <4.0)
Chloride, Urine: 101 mmol/(24.h) (ref 70–250)
Citrate, Urine: 371 mg/(24.h) — ABNORMAL LOW (ref >450)
Creatinine, Urine: 1401 mg/(24.h)
Creatinine/Kg Body Weight: 10.8 mg/kg/d — ABNORMAL LOW (ref 11.9–24.4)
Cystine, Urine, Qualitative: NEGATIVE
Magnesium, Urine: 54 mg/(24.h) (ref 30–120)
Oxalate, Urine: 24 mg/(24.h) (ref 20–40)
Phosphorus, Urine: 642 mg/(24.h) (ref 600–1200)
Potassium, Urine: 42 mmol/(24.h) (ref 20–100)
Protein Catabolic Rate: 0.6 g/kg/d — ABNORMAL LOW (ref 0.8–1.4)
Sodium, Urine: 107 mmol/(24.h) (ref 50–150)
Sulfate, Urine: 38 meq/(24.h) (ref 20–80)
Urea Nitrogen, Urine: 9.21 g/(24.h) (ref 6.00–14.00)
Uric Acid Saturation: 1.73 — ABNORMAL HIGH (ref <1.00)
Uric Acid, Urine: 776 mg/(24.h) (ref <800)
Urine Volume (Preserved): 1120 mL/(24.h) (ref 500–4000)
pH, 24 hr, Urine: 5.878 (ref 5.800–6.200)

## 2023-03-15 ENCOUNTER — Telehealth: Payer: Self-pay | Admitting: Urology

## 2023-03-15 ENCOUNTER — Encounter: Payer: Self-pay | Admitting: Urology

## 2023-03-15 NOTE — Telephone Encounter (Signed)
-----   Message from [facility] sent at 03/15/2023 11:22 AM EST ----- Please schedule a f/u appt for 6 months.

## 2023-03-15 NOTE — Telephone Encounter (Signed)
 LVM for pt to call back and sch

## 2023-03-20 ENCOUNTER — Telehealth: Payer: Self-pay | Admitting: Urology

## 2023-03-20 NOTE — Telephone Encounter (Signed)
-----   Message from [facility] sent at 03/20/2023  8:21 AM EST ----- Please schedule preop appt in next 2-3 weeks

## 2023-03-20 NOTE — Telephone Encounter (Signed)
 Pt scheduled 04/06/23

## 2023-04-06 ENCOUNTER — Encounter: Payer: Self-pay | Admitting: Urology

## 2023-04-06 ENCOUNTER — Ambulatory Visit (INDEPENDENT_AMBULATORY_CARE_PROVIDER_SITE_OTHER): Payer: No Typology Code available for payment source | Admitting: Urology

## 2023-04-06 VITALS — BP 147/96 | HR 77 | Ht 70.5 in | Wt 285.0 lb

## 2023-04-06 DIAGNOSIS — N200 Calculus of kidney: Secondary | ICD-10-CM | POA: Diagnosis not present

## 2023-04-06 LAB — MICROSCOPIC EXAMINATION

## 2023-04-06 LAB — URINALYSIS, ROUTINE W REFLEX MICROSCOPIC
Bilirubin, UA: NEGATIVE
Glucose, UA: NEGATIVE
Ketones, UA: NEGATIVE
Leukocytes,UA: NEGATIVE
Nitrite, UA: NEGATIVE
Protein,UA: NEGATIVE
Specific Gravity, UA: 1.02 (ref 1.005–1.030)
Urobilinogen, Ur: 0.2 mg/dL (ref 0.2–1.0)
pH, UA: 7 (ref 5.0–7.5)

## 2023-04-06 NOTE — Progress Notes (Signed)
 Assessment: 1. Nephrolithiasis     Plan: Continue kidney stone prevention  Treatment options for the renal calculi discussed with the patient.  He would like to treat any larger stones that are in a threatening position.  I recommended that we treat the larger calculi on the left in the midpole and the upper pole.   KUB from 02/23/2023 shows a 7 mm calcification in the mid left renal shadow and a cluster of calcifications in the upper left renal shadow.  I would recommend that we focus treatment on these areas.   I discussed lithotripsy in detail with him including expectations and potential side effects.  He understands wishes to proceed.  Procedure: The patient will be scheduled for left ESL at [facility].  Surgical request is placed with the surgery schedulers and will be scheduled at the patient's/family request. Informed consent is given as documented below. Anesthesia:  local  The patient does not have sleep apnea, history of MRSA, history of VRE, history of cardiac device requiring special anesthetic needs. Patient is stable and considered clear for surgical management in an outpatient ambulatory surgery setting as well as inpatient hospital setting.  Consent for Operation or Procedure: Provider Certification I hereby certify that the nature, purpose, benefits, usual and most frequent risks of, and alternatives to, the operation or procedure have been explained to the patient (or person authorized to sign for the patient) either by me as responsible physician or by the provider who is to perform the operation or procedure. Time spent such that the patient/family has had an opportunity to ask questions, and that those questions have been answered. The patient or the patient's representative has been advised that selected tasks may be performed by assistants to the primary health care provider(s). I believe that the patient (or person authorized to sign for the patient) understands what  has been explained, and has consented to the operation or procedure. No guarantees were implied or made.   Chief Complaint:  Chief Complaint  Patient presents with   Nephrolithiasis    History of Present Illness:  Scott Knox is a 58 y.o. male who is seen for further evaluation of ureteral calculus and nephrolithiasis.  He reports a history of nephrolithiasis dating back to 04-14-19.  He has passed multiple stones over the past few years.  His most recent episode was in January 2025.  He had right sided flank pain.  His pain was severe enough that he presented to the emergency room for evaluation.  CT imaging from 02/13/2023 showed a 5 mm calculus in the bladder at the right UVJ with mild to moderate right hydronephrosis and numerous bilateral nonobstructing renal calculi.  He reported passing that stone approximately 2 days later.   At his visit in January 2025, he was not having any pain.  No dysuria or gross hematuria.  He had not required surgical intervention for his kidney stones.  Stone analysis:  95% calcium oxalate, 5% hydroxyapatite 24 hr urine:   - low urine volume (1.12 L/day) - low urine citrate  KUB from 1/25:  bilateral nephrolithiasis  He presents today to arrange for ESL for treatment of his nephrolithiasis. He reports passing a stone since his last visit.  He did have some left-sided flank pain earlier this week which has now resolved.  No dysuria or gross hematuria.  Portions of the above documentation were copied from a prior visit for review purposes only.    Past Medical History:  Past Medical History:  Diagnosis Date   Acid reflux    Hypertension    Kidney stones     Past Surgical History:  Past Surgical History:  Procedure Laterality Date   KNEE ARTHROSCOPY W/ MENISCAL REPAIR      Allergies:  Allergies  Allergen Reactions   Bee Venom Anaphylaxis    Family History:  No family history on file.  Social History:  Social History   Tobacco Use    Smoking status: Never   Smokeless tobacco: Never  Substance Use Topics   Alcohol use: Not Currently   Drug use: No    ROS: Constitutional:  Negative for fever, chills, weight loss CV: Negative for chest pain, previous MI, hypertension Respiratory:  Negative for shortness of breath, wheezing, sleep apnea, frequent cough GI:  Negative for nausea, vomiting, bloody stool, GERD  Physical exam: BP (!) 147/96   Pulse 77   Ht 5' 10.5" (1.791 m)   Wt 285 lb (129.3 kg)   BMI 40.32 kg/m  GENERAL APPEARANCE:  Well appearing, well developed, well nourished, NAD HEENT:  Atraumatic, normocephalic, oropharynx clear NECK:  Supple without lymphadenopathy or thyromegaly ABDOMEN:  Soft, non-tender, no masses EXTREMITIES:  Moves all extremities well, without clubbing, cyanosis, or edema NEUROLOGIC:  Alert and oriented x 3, normal gait, CN II-XII grossly intact MENTAL STATUS:  appropriate BACK:  Non-tender to palpation, No CVAT SKIN:  Warm, dry, and intact  Results: U/A: 0-5 WBCs, 0-2 RBCs, few bacteria

## 2023-04-06 NOTE — H&P (View-Only) (Signed)
 Assessment: 1. Nephrolithiasis     Plan: Continue kidney stone prevention  Treatment options for the renal calculi discussed with the patient.  He would like to treat any larger stones that are in a threatening position.  I recommended that we treat the larger calculi on the left in the midpole and the upper pole.   KUB from 02/23/2023 shows a 7 mm calcification in the mid left renal shadow and a cluster of calcifications in the upper left renal shadow.  I would recommend that we focus treatment on these areas.   I discussed lithotripsy in detail with him including expectations and potential side effects.  He understands wishes to proceed.  Procedure: The patient will be scheduled for left ESL at [facility].  Surgical request is placed with the surgery schedulers and will be scheduled at the patient's/family request. Informed consent is given as documented below. Anesthesia:  local  The patient does not have sleep apnea, history of MRSA, history of VRE, history of cardiac device requiring special anesthetic needs. Patient is stable and considered clear for surgical management in an outpatient ambulatory surgery setting as well as inpatient hospital setting.  Consent for Operation or Procedure: Provider Certification I hereby certify that the nature, purpose, benefits, usual and most frequent risks of, and alternatives to, the operation or procedure have been explained to the patient (or person authorized to sign for the patient) either by me as responsible physician or by the provider who is to perform the operation or procedure. Time spent such that the patient/family has had an opportunity to ask questions, and that those questions have been answered. The patient or the patient's representative has been advised that selected tasks may be performed by assistants to the primary health care provider(s). I believe that the patient (or person authorized to sign for the patient) understands what  has been explained, and has consented to the operation or procedure. No guarantees were implied or made.   Chief Complaint:  Chief Complaint  Patient presents with   Nephrolithiasis    History of Present Illness:  Scott Knox is a 58 y.o. male who is seen for further evaluation of ureteral calculus and nephrolithiasis.  He reports a history of nephrolithiasis dating back to 04-14-19.  He has passed multiple stones over the past few years.  His most recent episode was in January 2025.  He had right sided flank pain.  His pain was severe enough that he presented to the emergency room for evaluation.  CT imaging from 02/13/2023 showed a 5 mm calculus in the bladder at the right UVJ with mild to moderate right hydronephrosis and numerous bilateral nonobstructing renal calculi.  He reported passing that stone approximately 2 days later.   At his visit in January 2025, he was not having any pain.  No dysuria or gross hematuria.  He had not required surgical intervention for his kidney stones.  Stone analysis:  95% calcium oxalate, 5% hydroxyapatite 24 hr urine:   - low urine volume (1.12 L/day) - low urine citrate  KUB from 1/25:  bilateral nephrolithiasis  He presents today to arrange for ESL for treatment of his nephrolithiasis. He reports passing a stone since his last visit.  He did have some left-sided flank pain earlier this week which has now resolved.  No dysuria or gross hematuria.  Portions of the above documentation were copied from a prior visit for review purposes only.    Past Medical History:  Past Medical History:  Diagnosis Date   Acid reflux    Hypertension    Kidney stones     Past Surgical History:  Past Surgical History:  Procedure Laterality Date   KNEE ARTHROSCOPY W/ MENISCAL REPAIR      Allergies:  Allergies  Allergen Reactions   Bee Venom Anaphylaxis    Family History:  No family history on file.  Social History:  Social History   Tobacco Use    Smoking status: Never   Smokeless tobacco: Never  Substance Use Topics   Alcohol use: Not Currently   Drug use: No    ROS: Constitutional:  Negative for fever, chills, weight loss CV: Negative for chest pain, previous MI, hypertension Respiratory:  Negative for shortness of breath, wheezing, sleep apnea, frequent cough GI:  Negative for nausea, vomiting, bloody stool, GERD  Physical exam: BP (!) 147/96   Pulse 77   Ht 5' 10.5" (1.791 m)   Wt 285 lb (129.3 kg)   BMI 40.32 kg/m  GENERAL APPEARANCE:  Well appearing, well developed, well nourished, NAD HEENT:  Atraumatic, normocephalic, oropharynx clear NECK:  Supple without lymphadenopathy or thyromegaly ABDOMEN:  Soft, non-tender, no masses EXTREMITIES:  Moves all extremities well, without clubbing, cyanosis, or edema NEUROLOGIC:  Alert and oriented x 3, normal gait, CN II-XII grossly intact MENTAL STATUS:  appropriate BACK:  Non-tender to palpation, No CVAT SKIN:  Warm, dry, and intact  Results: U/A: 0-5 WBCs, 0-2 RBCs, few bacteria

## 2023-04-13 ENCOUNTER — Ambulatory Visit: Payer: Self-pay | Admitting: Urology

## 2023-04-13 ENCOUNTER — Encounter (HOSPITAL_COMMUNITY): Payer: Self-pay | Admitting: Urology

## 2023-04-13 DIAGNOSIS — N200 Calculus of kidney: Secondary | ICD-10-CM

## 2023-04-13 NOTE — Progress Notes (Signed)
 Spoke w/ via phone for pre-op interview---[facility] needs dos---- KUB              COVID test -----patient states asymptomatic no test needed Arrive at -------1300 NPO after MN NO Solid Food.  Clear liquids from MN until---11 AM Med rec completed. Pt aware to hold ASA/NSAIDs and supplements per [facility] protocol.  Medications to take morning of surgery -----BP meds pepcid Flomax Diabetic/Weight loss medication ----- No Alcohol or recreational drugs for 24 hours/Tobacco products for 6 hours ----1100 Patient instructed to bring blue lithotripsy folder, photo id and insurance card day of surgery. Patient aware to have Driver (ride ) / caregiver for 24 hours after surgery -----Herbert Seta Patient Special Instructions ----- Pre-Op special Instructions ----- take laxative of choice day before procedure Patient verbalized understanding of instructions that were given at this phone interview. Patient denies shortness of breath, chest pain, fever, cough at this phone interview.

## 2023-04-17 ENCOUNTER — Ambulatory Visit (HOSPITAL_COMMUNITY)
Admission: RE | Admit: 2023-04-17 | Discharge: 2023-04-17 | Disposition: A | Discharge status: Home / Self Care | Attending: Urology | Admitting: Urology

## 2023-04-17 ENCOUNTER — Other Ambulatory Visit: Payer: Self-pay

## 2023-04-17 ENCOUNTER — Encounter (HOSPITAL_COMMUNITY)
Admission: RE | Disposition: A | Discharge status: Home / Self Care | Payer: Self-pay | Source: Home / Self Care | Attending: Urology

## 2023-04-17 ENCOUNTER — Encounter (HOSPITAL_COMMUNITY): Payer: Self-pay | Admitting: Urology

## 2023-04-17 ENCOUNTER — Ambulatory Visit (HOSPITAL_COMMUNITY)

## 2023-04-17 DIAGNOSIS — N200 Calculus of kidney: Secondary | ICD-10-CM

## 2023-04-17 DIAGNOSIS — N132 Hydronephrosis with renal and ureteral calculous obstruction: Secondary | ICD-10-CM | POA: Insufficient documentation

## 2023-04-17 HISTORY — DX: Personal history of urinary calculi: Z87.442

## 2023-04-17 HISTORY — PX: EXTRACORPOREAL SHOCK WAVE LITHOTRIPSY: SHX1557

## 2023-04-17 LAB — POCT I-STAT, CHEM 8
BUN: 8 mg/dL (ref 6–20)
Calcium, Ion: 1.16 mmol/L (ref 1.15–1.40)
Chloride: 107 mmol/L (ref 98–111)
Creatinine, Ser: 0.8 mg/dL (ref 0.61–1.24)
Glucose, Bld: 98 mg/dL (ref 70–99)
HCT: 43 % (ref 39.0–52.0)
Hemoglobin: 14.6 g/dL (ref 13.0–17.0)
Potassium: 3.2 mmol/L — ABNORMAL LOW (ref 3.5–5.1)
Sodium: 142 mmol/L (ref 135–145)
TCO2: 24 mmol/L (ref 22–32)

## 2023-04-17 SURGERY — LITHOTRIPSY, ESWL
Anesthesia: LOCAL | Laterality: Left

## 2023-04-17 MED ORDER — HYDROCODONE-ACETAMINOPHEN 5-325 MG PO TABS: 1.0000 | ORAL_TABLET | Freq: Four times a day (QID) | ORAL | 0 refills | Status: DC | PRN

## 2023-04-17 MED ORDER — SODIUM CHLORIDE 0.9 % IV SOLN
INTRAVENOUS | Status: DC
  Administered 2023-04-17: 1000 mL via INTRAVENOUS

## 2023-04-17 MED ORDER — TAMSULOSIN HCL 0.4 MG PO CAPS: ORAL_CAPSULE | ORAL | 1 refills | Status: DC

## 2023-04-17 MED ORDER — CIPROFLOXACIN HCL 500 MG PO TABS
500.0000 mg | ORAL_TABLET | ORAL | Status: AC
  Administered 2023-04-17: 500 mg via ORAL
  Filled 2023-04-17: qty 1

## 2023-04-17 MED ORDER — DIPHENHYDRAMINE HCL 25 MG PO CAPS
25.0000 mg | ORAL_CAPSULE | ORAL | Status: AC
  Administered 2023-04-17: 25 mg via ORAL
  Filled 2023-04-17: qty 1

## 2023-04-17 MED ORDER — DIAZEPAM 5 MG PO TABS
10.0000 mg | ORAL_TABLET | ORAL | Status: AC
  Administered 2023-04-17: 10 mg via ORAL
  Filled 2023-04-17: qty 2

## 2023-04-17 NOTE — Interval H&P Note (Signed)
 History and Physical Interval Note:  04/17/2023 12:46 PM  Scott Knox  has presented today for surgery, with the diagnosis of left nephrolithiasis.  The various methods of treatment have been discussed with the patient and family. After consideration of risks, benefits and other options for treatment, the patient has consented to  Procedure(s): LITHOTRIPSY, ESWL (Left) as a surgical intervention.  The patient's history has been reviewed, patient examined, no change in status, stable for surgery.  I have reviewed the patient's chart and labs.  Questions were answered to the patient's satisfaction.     [name]

## 2023-04-17 NOTE — Op Note (Signed)
 Operative Note  Procedure:  left shock wave lithotripsy  Please see Operative Note from [facility] scanned into chart.  Milderd Meager, MD [facility] [phone]

## 2023-04-18 ENCOUNTER — Encounter (HOSPITAL_COMMUNITY): Payer: Self-pay | Admitting: Urology

## 2023-04-24 ENCOUNTER — Other Ambulatory Visit: Payer: Self-pay

## 2023-04-24 DIAGNOSIS — N200 Calculus of kidney: Secondary | ICD-10-CM

## 2023-04-24 DIAGNOSIS — N201 Calculus of ureter: Secondary | ICD-10-CM

## 2023-04-25 ENCOUNTER — Encounter: Payer: Self-pay | Admitting: Urology

## 2023-04-25 ENCOUNTER — Ambulatory Visit (HOSPITAL_BASED_OUTPATIENT_CLINIC_OR_DEPARTMENT_OTHER)
Admission: RE | Admit: 2023-04-25 | Discharge: 2023-04-25 | Disposition: A | Discharge status: Home / Self Care | Source: Ambulatory Visit | Attending: Urology | Admitting: Urology

## 2023-04-25 ENCOUNTER — Ambulatory Visit (INDEPENDENT_AMBULATORY_CARE_PROVIDER_SITE_OTHER): Admitting: Urology

## 2023-04-25 VITALS — BP 150/93 | HR 71 | Ht 70.0 in | Wt 285.0 lb

## 2023-04-25 DIAGNOSIS — N201 Calculus of ureter: Secondary | ICD-10-CM | POA: Diagnosis present

## 2023-04-25 DIAGNOSIS — N200 Calculus of kidney: Secondary | ICD-10-CM | POA: Diagnosis present

## 2023-04-25 LAB — URINALYSIS, ROUTINE W REFLEX MICROSCOPIC
Bilirubin, UA: NEGATIVE
Glucose, UA: NEGATIVE
Ketones, UA: NEGATIVE
Leukocytes,UA: NEGATIVE
Nitrite, UA: NEGATIVE
Protein,UA: NEGATIVE
Specific Gravity, UA: 1.02 (ref 1.005–1.030)
Urobilinogen, Ur: 0.2 mg/dL (ref 0.2–1.0)
pH, UA: 6 (ref 5.0–7.5)

## 2023-04-25 LAB — MICROSCOPIC EXAMINATION: Bacteria, UA: NONE SEEN

## 2023-04-25 MED ORDER — TAMSULOSIN HCL 0.4 MG PO CAPS: ORAL_CAPSULE | ORAL | 1 refills | Status: DC

## 2023-04-25 NOTE — Progress Notes (Signed)
 Assessment: 1. Nephrolithiasis     Plan: I reviewed the KUB study from today.  There appears to be some changes consistent with fragmentation to the mid pole calculus in the left kidney. Continue tamsulosin. Return to office in 2-3 weeks with repeat KUB.  Chief Complaint:  Chief Complaint  Patient presents with   Nephrolithiasis    History of Present Illness:  Scott Knox is a 57 y.o. male who is seen for further evaluation of ureteral calculus and nephrolithiasis.  He reports a history of nephrolithiasis dating back to 10-May-2019.  He has passed multiple stones over the past few years.  His most recent episode was in January 2025.  He had right sided flank pain.  His pain was severe enough that he presented to the emergency room for evaluation.  CT imaging from 02/13/2023 showed a 5 mm calculus in the bladder at the right UVJ with mild to moderate right hydronephrosis and numerous bilateral nonobstructing renal calculi.  He reported passing that stone approximately 2 days later.   At his visit in January 2025, he was not having any pain.  No dysuria or gross hematuria.  He had not required surgical intervention for his kidney stones.  Stone analysis:  95% calcium oxalate, 5% hydroxyapatite 24 hr urine:   - low urine volume (1.12 L/day) - low urine citrate  KUB from 1/25:  bilateral nephrolithiasis  He underwent left ESL for a mid left renal calculus on 04/17/2023. He returns today for follow-up.  He has done well since the procedure.  He has not passed any significant stone fragments.  He has not had any flank pain, dysuria, or gross hematuria.   Portions of the above documentation were copied from a prior visit for review purposes only.    Past Medical History:  Past Medical History:  Diagnosis Date   Acid reflux    History of kidney stones    Hypertension     Past Surgical History:  Past Surgical History:  Procedure Laterality Date   EXTRACORPOREAL SHOCK WAVE  LITHOTRIPSY Left 04/17/2023   Procedure: LITHOTRIPSY, ESWL;  Surgeon: Milderd Meager., MD;  Location: [facility];  Service: Urology;  Laterality: Left;   KNEE ARTHROSCOPY W/ MENISCAL REPAIR     LUMBAR EPIDURAL INJECTION  12/09/2022   steroidal injections    Allergies:  Allergies  Allergen Reactions   Bee Venom Anaphylaxis    Family History:  No family history on file.  Social History:  Social History   Tobacco Use   Smoking status: Never   Smokeless tobacco: Never  Vaping Use   Vaping status: Never Used  Substance Use Topics   Alcohol use: Not Currently   Drug use: No    ROS: Constitutional:  Negative for fever, chills, weight loss CV: Negative for chest pain, previous MI, hypertension Respiratory:  Negative for shortness of breath, wheezing, sleep apnea, frequent cough GI:  Negative for nausea, vomiting, bloody stool, GERD  Physical exam: BP (!) 150/93   Pulse 71   Ht 5\' 10"  (1.778 m)   Wt 285 lb (129.3 kg)   BMI 40.89 kg/m  GENERAL APPEARANCE:  Well appearing, well developed, well nourished, NAD HEENT:  Atraumatic, normocephalic, oropharynx clear NECK:  Supple without lymphadenopathy or thyromegaly ABDOMEN:  Soft, non-tender, no masses EXTREMITIES:  Moves all extremities well, without clubbing, cyanosis, or edema NEUROLOGIC:  Alert and oriented x 3, normal gait, CN II-XII grossly intact MENTAL STATUS:  appropriate BACK:  Non-tender to palpation, No CVAT  SKIN:  Warm, dry, and intact  Results: U/A: 0-5 WBCs, 3-10 RBCs

## 2023-05-06 ENCOUNTER — Other Ambulatory Visit: Payer: Self-pay | Admitting: Urology

## 2023-05-06 DIAGNOSIS — N200 Calculus of kidney: Secondary | ICD-10-CM

## 2023-06-07 NOTE — Progress Notes (Unsigned)
 Assessment: 1. Nephrolithiasis     Plan: KUB from today reviewed.  The study shows multiple small calcifications throughout the left renal shadow.  The previously noted calcification in the mid left renal shadow is not readily visible consistent with fragmentation. Continue stone prevention. Schedule for renal ultrasound in 3-4 weeks-will call with results and to arrange follow-up.  Chief Complaint:  Chief Complaint  Patient presents with   Nephrolithiasis    History of Present Illness:  Scott Knox is a 58 y.o. male who is seen for further evaluation of ureteral calculus and nephrolithiasis.  He reports a history of nephrolithiasis dating back to 06/18/2019.  He has passed multiple stones over the past few years.  His most recent episode was in January 2025.  He had right sided flank pain.  His pain was severe enough that he presented to the emergency room for evaluation.  CT imaging from 02/13/2023 showed a 5 mm calculus in the bladder at the right UVJ with mild to moderate right hydronephrosis and numerous bilateral nonobstructing renal calculi.  He reported passing that stone approximately 2 days later.   At his visit in January 2025, he was not having any pain.  No dysuria or gross hematuria.  He had not required surgical intervention for his kidney stones.  Stone analysis:  95% calcium oxalate, 5% hydroxyapatite 24 hr urine:   - low urine volume (1.12 L/day) - low urine citrate  KUB from 1/25:  bilateral nephrolithiasis  He underwent left ESL for a mid left renal calculus on 04/17/2023. He has done well since the procedure.  At his visit on 04/25/23, he had not passed any significant stone fragments.  He had not had any flank pain, dysuria, or gross hematuria. KUB from 04/25/23 showed fragmentation of the renal calculus.  He returns today for follow-up.  He reports passing a fairly sizable stone fragment yesterday.  He had some mild symptoms associated with passage.  No current flank  pain.  Portions of the above documentation were copied from a prior visit for review purposes only.    Past Medical History:  Past Medical History:  Diagnosis Date   Acid reflux    History of kidney stones    Hypertension     Past Surgical History:  Past Surgical History:  Procedure Laterality Date   EXTRACORPOREAL SHOCK WAVE LITHOTRIPSY Left 04/17/2023   Procedure: LITHOTRIPSY, ESWL;  Surgeon: Mellie Sprinkle., MD;  Location: [facility];  Service: Urology;  Laterality: Left;   KNEE ARTHROSCOPY W/ MENISCAL REPAIR     LUMBAR EPIDURAL INJECTION  12/09/2022   steroidal injections    Allergies:  Allergies  Allergen Reactions   Bee Venom Anaphylaxis    Family History:  No family history on file.  Social History:  Social History   Tobacco Use   Smoking status: Never   Smokeless tobacco: Never  Vaping Use   Vaping status: Never Used  Substance Use Topics   Alcohol use: Not Currently   Drug use: No    ROS: Constitutional:  Negative for fever, chills, weight loss CV: Negative for chest pain, previous MI, hypertension Respiratory:  Negative for shortness of breath, wheezing, sleep apnea, frequent cough GI:  Negative for nausea, vomiting, bloody stool, GERD  Physical exam: BP (!) 157/98   Pulse 81   Ht 5\' 10"  (1.778 m)   Wt 280 lb (127 kg)   BMI 40.18 kg/m  GENERAL APPEARANCE:  Well appearing, well developed, well nourished, NAD HEENT:  Atraumatic,  normocephalic, oropharynx clear NECK:  Supple without lymphadenopathy or thyromegaly ABDOMEN:  Soft, non-tender, no masses EXTREMITIES:  Moves all extremities well, without clubbing, cyanosis, or edema NEUROLOGIC:  Alert and oriented x 3, normal gait, CN II-XII grossly intact MENTAL STATUS:  appropriate BACK:  Non-tender to palpation, No CVAT SKIN:  Warm, dry, and intact  Results: U/A: 0-5 WBCs, 11-30 RBCs

## 2023-06-08 ENCOUNTER — Ambulatory Visit (HOSPITAL_BASED_OUTPATIENT_CLINIC_OR_DEPARTMENT_OTHER)
Admission: RE | Admit: 2023-06-08 | Discharge: 2023-06-08 | Disposition: A | Discharge status: Home / Self Care | Source: Ambulatory Visit | Attending: Urology | Admitting: Urology

## 2023-06-08 ENCOUNTER — Ambulatory Visit (INDEPENDENT_AMBULATORY_CARE_PROVIDER_SITE_OTHER): Admitting: Urology

## 2023-06-08 ENCOUNTER — Encounter: Payer: Self-pay | Admitting: Urology

## 2023-06-08 ENCOUNTER — Other Ambulatory Visit: Payer: Self-pay

## 2023-06-08 VITALS — BP 157/98 | HR 81 | Ht 70.0 in | Wt 280.0 lb

## 2023-06-08 DIAGNOSIS — N200 Calculus of kidney: Secondary | ICD-10-CM | POA: Insufficient documentation

## 2023-06-08 DIAGNOSIS — N201 Calculus of ureter: Secondary | ICD-10-CM | POA: Diagnosis present

## 2023-06-08 LAB — URINALYSIS, ROUTINE W REFLEX MICROSCOPIC
Bilirubin, UA: NEGATIVE
Glucose, UA: NEGATIVE
Ketones, UA: NEGATIVE
Leukocytes,UA: NEGATIVE
Nitrite, UA: NEGATIVE
Protein,UA: NEGATIVE
Specific Gravity, UA: 1.01 (ref 1.005–1.030)
Urobilinogen, Ur: 0.2 mg/dL (ref 0.2–1.0)
pH, UA: 7 (ref 5.0–7.5)

## 2023-06-08 LAB — MICROSCOPIC EXAMINATION: Bacteria, UA: NONE SEEN

## 2023-07-04 ENCOUNTER — Ambulatory Visit (HOSPITAL_BASED_OUTPATIENT_CLINIC_OR_DEPARTMENT_OTHER)
Admission: RE | Admit: 2023-07-04 | Discharge: 2023-07-04 | Disposition: A | Discharge status: Home / Self Care | Source: Ambulatory Visit | Attending: Urology | Admitting: Urology

## 2023-07-04 DIAGNOSIS — N200 Calculus of kidney: Secondary | ICD-10-CM | POA: Diagnosis not present

## 2023-07-10 ENCOUNTER — Ambulatory Visit: Payer: Self-pay | Admitting: Urology

## 2023-09-10 ENCOUNTER — Other Ambulatory Visit: Payer: Self-pay

## 2023-09-10 ENCOUNTER — Emergency Department
Admission: EM | Admit: 2023-09-10 | Discharge: 2023-09-10 | Disposition: A | Discharge status: Home / Self Care | Attending: Emergency Medicine | Admitting: Emergency Medicine

## 2023-09-10 ENCOUNTER — Emergency Department

## 2023-09-10 DIAGNOSIS — N132 Hydronephrosis with renal and ureteral calculous obstruction: Secondary | ICD-10-CM | POA: Insufficient documentation

## 2023-09-10 DIAGNOSIS — N201 Calculus of ureter: Secondary | ICD-10-CM

## 2023-09-10 DIAGNOSIS — R109 Unspecified abdominal pain: Secondary | ICD-10-CM | POA: Diagnosis present

## 2023-09-10 LAB — URINALYSIS, ROUTINE W REFLEX MICROSCOPIC
Bilirubin Urine: NEGATIVE
Glucose, UA: NEGATIVE mg/dL
Ketones, ur: NEGATIVE mg/dL
Leukocytes,Ua: NEGATIVE
Nitrite: NEGATIVE
Protein, ur: NEGATIVE mg/dL
RBC / HPF: >50 RBC/hpf (ref 0–5)
Specific Gravity, Urine: 1.016 (ref 1.005–1.030)
pH: 5 (ref 5.0–8.0)

## 2023-09-10 LAB — COMPREHENSIVE METABOLIC PANEL WITH GFR
ALT: 32 U/L (ref 0–44)
AST: 26 U/L (ref 15–41)
Albumin: 4.1 g/dL (ref 3.5–5.0)
Alkaline Phosphatase: 62 U/L (ref 38–126)
Anion gap: 10 (ref 5–15)
BUN: 14 mg/dL (ref 6–20)
CO2: 23 mmol/L (ref 22–32)
Calcium: 9.4 mg/dL (ref 8.9–10.3)
Chloride: 109 mmol/L (ref 98–111)
Creatinine, Ser: 1.2 mg/dL (ref 0.61–1.24)
GFR, Estimated: >60 mL/min (ref >60)
Glucose, Bld: 148 mg/dL — ABNORMAL HIGH (ref 70–99)
Potassium: 3.3 mmol/L — ABNORMAL LOW (ref 3.5–5.1)
Sodium: 142 mmol/L (ref 135–145)
Total Bilirubin: 0.9 mg/dL (ref 0.0–1.2)
Total Protein: 7.1 g/dL (ref 6.5–8.1)

## 2023-09-10 LAB — CBC
HCT: 40.6 % (ref 39.0–52.0)
Hemoglobin: 13.4 g/dL (ref 13.0–17.0)
MCH: 29.3 pg (ref 26.0–34.0)
MCHC: 33 g/dL (ref 30.0–36.0)
MCV: 88.6 fL (ref 80.0–100.0)
Platelets: 213 K/uL (ref 150–400)
RBC: 4.58 MIL/uL (ref 4.22–5.81)
RDW: 13.6 % (ref 11.5–15.5)
WBC: 9.6 K/uL (ref 4.0–10.5)
nRBC: 0 % (ref 0.0–0.2)

## 2023-09-10 MED ORDER — ONDANSETRON 4 MG PO TBDP: 4.0000 mg | ORAL_TABLET | Freq: Three times a day (TID) | ORAL | 0 refills | Status: DC | PRN

## 2023-09-10 MED ORDER — DIPHENHYDRAMINE HCL 50 MG/ML IJ SOLN: 12.5000 mg | Freq: Once | INTRAMUSCULAR | Status: DC

## 2023-09-10 MED ORDER — METHYLPREDNISOLONE SODIUM SUCC 125 MG IJ SOLR: 125.0000 mg | Freq: Once | INTRAMUSCULAR | Status: DC

## 2023-09-10 MED ORDER — CEPHALEXIN 500 MG PO CAPS: 500.0000 mg | ORAL_CAPSULE | Freq: Three times a day (TID) | ORAL | 0 refills | Status: AC

## 2023-09-10 MED ORDER — OXYCODONE-ACETAMINOPHEN 10-325 MG PO TABS: 1.0000 | ORAL_TABLET | Freq: Four times a day (QID) | ORAL | 0 refills | Status: DC | PRN

## 2023-09-10 MED ORDER — OXYCODONE HCL 5 MG PO TABS
5.0000 mg | ORAL_TABLET | Freq: Once | ORAL | Status: AC
  Administered 2023-09-10: 5 mg via ORAL
  Filled 2023-09-10: qty 1

## 2023-09-10 MED ORDER — ONDANSETRON HCL 4 MG/2ML IJ SOLN: 4.0000 mg | Freq: Once | INTRAMUSCULAR | Status: DC

## 2023-09-10 MED ORDER — TAMSULOSIN HCL 0.4 MG PO CAPS: 0.4000 mg | ORAL_CAPSULE | Freq: Every day | ORAL | 0 refills | Status: DC

## 2023-09-10 NOTE — ED Provider Notes (Signed)
 IV and tomorrow off.  [facility] Provider Note    Event Date/Time   First MD Initiated Contact with Patient 09/10/23 1212     (approximate)   History   Flank Pain   HPI  Scott Knox is a 58 y.o. male with history of hypertension, kidney stones and as listed in EMR presents to the emergency department for treatment and evaluation of left flank pain since 6 AM.  Pain is similar to previous kidney stone.  He has required lithotripsy in the past.  He has taken Flomax  and pain medication this morning.     Physical Exam    Vitals:   09/10/23 1139  BP: (!) 151/97  Pulse: 77  Resp: 19  Temp: 98.2 F (36.8 C)  SpO2: 96%    General: Awake, no distress.  CV:  Good peripheral perfusion.  Resp:  Normal effort.  Abd:  No distention.  Other:  CVA tenderness on the left.   ED Results / Procedures / Treatments   Labs (all labs ordered are listed, but only abnormal results are displayed)  Labs Reviewed  COMPREHENSIVE METABOLIC PANEL WITH GFR - Abnormal; Notable for the following components:      Result Value   Potassium 3.3 (*)    Glucose, Bld 148 (*)    All other components within normal limits  URINALYSIS, ROUTINE W REFLEX MICROSCOPIC - Abnormal; Notable for the following components:   Color, Urine YELLOW (*)    APPearance CLEAR (*)    Hgb urine dipstick LARGE (*)    Bacteria, UA RARE (*)    All other components within normal limits  CBC     EKG  Not indicated.   RADIOLOGY  Image and radiology report reviewed and interpreted by me. Radiology report consistent with the same.  5mm proximal left ureteral calculus with mild left-sided hydronephrosis and proximal left hydroureter.  PROCEDURES:  Critical Care performed: No  Procedures   MEDICATIONS ORDERED IN ED:  Medications  diphenhydrAMINE  (BENADRYL ) injection 12.5 mg (has no administration in time range)  methylPREDNISolone  sodium succinate (SOLU-MEDROL ) 125 mg/2 mL  injection 125 mg (has no administration in time range)  ondansetron  (ZOFRAN ) injection 4 mg (has no administration in time range)  oxyCODONE  (Oxy IR/ROXICODONE ) immediate release tablet 5 mg (5 mg Oral Given 09/10/23 1240)  oxyCODONE  (Oxy IR/ROXICODONE ) immediate release tablet 5 mg (5 mg Oral Given 09/10/23 1419)     IMPRESSION / MDM / ASSESSMENT AND PLAN / ED COURSE   I have reviewed the triage note and vital signs. Vital signs are stable   Differential diagnosis includes, but is not limited to, ureterolithiasis, nephrolithiasis, pyelonephritis  Patient's presentation is most consistent with acute illness / injury with system symptoms.  58 year old male presenting to the emergency department for treatment and evaluation of left-sided flank pain ongoing since 6 AM today.  See HPI for further details.  On exam, he does have CVA tenderness.  Plan will be to proceed with CT for renal stone study.  Patient agreeable to the plan.  5 mm proximal left ureteral calculus on CT.  Labs are overall reassuring.  Urinalysis shows a large amount of hemoglobin with 6-10 white blood cells and rare bacteria.  Plan will be to discharge him home with Keflex , refill Flomax  and pain medication.  He was instructed to call and schedule an appointment with urology.  ER return precautions discussed.      FINAL CLINICAL IMPRESSION(S) / ED DIAGNOSES   Final diagnoses:  Ureterolithiasis     Rx / DC Orders   ED Discharge Orders          Ordered    oxyCODONE -acetaminophen  (PERCOCET) 10-325 MG tablet  Every 6 hours PRN        09/10/23 1408    tamsulosin  (FLOMAX ) 0.4 MG CAPS capsule  Daily        09/10/23 1408    ondansetron  (ZOFRAN -ODT) 4 MG disintegrating tablet  Every 8 hours PRN        09/10/23 1408    cephALEXin  (KEFLEX ) 500 MG capsule  3 times daily        09/10/23 1408             Note:  This document was prepared using Dragon voice recognition software and may include unintentional  dictation errors.   Herlinda Kirk NOVAK, FNP 09/10/23 1526    Dicky Anes, MD 09/10/23 [phone]

## 2023-09-10 NOTE — ED Triage Notes (Signed)
 Pt arrives with left sided flank pain that started around 6am this morning. Pt denies n/v or fevers. Pt has hx of kidney stones.

## 2023-09-10 NOTE — Discharge Instructions (Signed)
 Follow-up with your urologist.  If your pain is not well-controlled with medications, seek primary care, urology, or return to the emergency department.

## 2023-11-16 ENCOUNTER — Emergency Department
Admission: EM | Admit: 2023-11-16 | Discharge: 2023-11-16 | Disposition: A | Discharge status: Home / Self Care | Attending: Emergency Medicine | Admitting: Emergency Medicine

## 2023-11-16 ENCOUNTER — Other Ambulatory Visit: Payer: Self-pay

## 2023-11-16 ENCOUNTER — Emergency Department

## 2023-11-16 DIAGNOSIS — N132 Hydronephrosis with renal and ureteral calculous obstruction: Secondary | ICD-10-CM | POA: Insufficient documentation

## 2023-11-16 DIAGNOSIS — N201 Calculus of ureter: Secondary | ICD-10-CM

## 2023-11-16 DIAGNOSIS — I10 Essential (primary) hypertension: Secondary | ICD-10-CM | POA: Diagnosis not present

## 2023-11-16 DIAGNOSIS — R109 Unspecified abdominal pain: Secondary | ICD-10-CM | POA: Diagnosis present

## 2023-11-16 LAB — BASIC METABOLIC PANEL WITH GFR
Anion gap: 11 (ref 5–15)
BUN: 16 mg/dL (ref 6–20)
CO2: 25 mmol/L (ref 22–32)
Calcium: 9.9 mg/dL (ref 8.9–10.3)
Chloride: 105 mmol/L (ref 98–111)
Creatinine, Ser: 1.62 mg/dL — ABNORMAL HIGH (ref 0.61–1.24)
GFR, Estimated: 49 mL/min — ABNORMAL LOW (ref >60)
Glucose, Bld: 127 mg/dL — ABNORMAL HIGH (ref 70–99)
Potassium: 3.7 mmol/L (ref 3.5–5.1)
Sodium: 141 mmol/L (ref 135–145)

## 2023-11-16 LAB — URINALYSIS, ROUTINE W REFLEX MICROSCOPIC
Bacteria, UA: NONE SEEN
Bilirubin Urine: NEGATIVE
Glucose, UA: NEGATIVE mg/dL
Ketones, ur: NEGATIVE mg/dL
Leukocytes,Ua: NEGATIVE
Nitrite: NEGATIVE
Protein, ur: NEGATIVE mg/dL
RBC / HPF: >50 RBC/hpf (ref 0–5)
Specific Gravity, Urine: 1.013 (ref 1.005–1.030)
pH: 6 (ref 5.0–8.0)

## 2023-11-16 LAB — CBC
HCT: 43.1 % (ref 39.0–52.0)
Hemoglobin: 14.2 g/dL (ref 13.0–17.0)
MCH: 29.5 pg (ref 26.0–34.0)
MCHC: 32.9 g/dL (ref 30.0–36.0)
MCV: 89.4 fL (ref 80.0–100.0)
Platelets: 210 K/uL (ref 150–400)
RBC: 4.82 MIL/uL (ref 4.22–5.81)
RDW: 13 % (ref 11.5–15.5)
WBC: 11.8 K/uL — ABNORMAL HIGH (ref 4.0–10.5)
nRBC: 0 % (ref 0.0–0.2)

## 2023-11-16 MED ORDER — NAPROXEN 500 MG PO TABS: 500.0000 mg | ORAL_TABLET | Freq: Two times a day (BID) | ORAL | 0 refills | Status: AC

## 2023-11-16 MED ORDER — MORPHINE SULFATE (PF) 4 MG/ML IV SOLN
4.0000 mg | Freq: Once | INTRAVENOUS | Status: AC
  Administered 2023-11-16: 4 mg via INTRAVENOUS
  Filled 2023-11-16: qty 1

## 2023-11-16 MED ORDER — ONDANSETRON 4 MG PO TBDP: 4.0000 mg | ORAL_TABLET | Freq: Three times a day (TID) | ORAL | 0 refills | Status: AC | PRN

## 2023-11-16 MED ORDER — TAMSULOSIN HCL 0.4 MG PO CAPS
0.4000 mg | ORAL_CAPSULE | ORAL | Status: AC
Start: 2023-11-16 — End: 2023-11-16
  Administered 2023-11-16: 0.4 mg via ORAL
  Filled 2023-11-16: qty 1

## 2023-11-16 MED ORDER — KETOROLAC TROMETHAMINE 15 MG/ML IJ SOLN
15.0000 mg | Freq: Once | INTRAMUSCULAR | Status: AC
  Administered 2023-11-16: 15 mg via INTRAVENOUS
  Filled 2023-11-16: qty 1

## 2023-11-16 MED ORDER — OXYCODONE-ACETAMINOPHEN 5-325 MG PO TABS: 1.0000 | ORAL_TABLET | Freq: Four times a day (QID) | ORAL | 0 refills | Status: AC | PRN

## 2023-11-16 MED ORDER — SODIUM CHLORIDE 0.9 % IV BOLUS
1000.0000 mL | Freq: Once | INTRAVENOUS | Status: AC
  Administered 2023-11-16: 1000 mL via INTRAVENOUS

## 2023-11-16 MED ORDER — TAMSULOSIN HCL 0.4 MG PO CAPS: 0.4000 mg | ORAL_CAPSULE | Freq: Every day | ORAL | 0 refills | Status: DC

## 2023-11-16 NOTE — ED Provider Notes (Addendum)
 [facility] Provider Note    Event Date/Time   First MD Initiated Contact with Patient 11/16/23 [phone]     (approximate)   History   Chief Complaint: Flank Pain and Abdominal Pain   HPI  Scott Knox is a 58 y.o. male with a history kidney stones, hypertension who comes ED complaining of left flank pain wrapping around to his left lower abdomen, started 2 days ago, waxing waning episodes.  No fever chills or chest pain.  No dysuria.        Past Medical History:  Diagnosis Date   Acid reflux    History of kidney stones    Hypertension     Current Outpatient Rx   Order #: [id number] Class: Normal   Order #: [id number] Class: Normal   Order #: [id number] Class: Normal   Order #: [id number] Class: Normal   Order #: [id number] Class: Historical Med   Order #: [id number] Class: Historical Med   Order #: [id number] Class: Historical Med   Order #: [id number] Class: Historical Med   Order #: [id number] Class: Normal   Order #: [id number] Class: Historical Med   Order #: [id number] Class: Historical Med   Order #: [id number] Class: Historical Med   Order #: [id number] Class: Normal    Past Surgical History:  Procedure Laterality Date   EXTRACORPOREAL SHOCK WAVE LITHOTRIPSY Left 04/17/2023   Procedure: LITHOTRIPSY, ESWL;  Surgeon: Roseann Adine PARAS., MD;  Location: [facility];  Service: Urology;  Laterality: Left;   KNEE ARTHROSCOPY W/ MENISCAL REPAIR     LUMBAR EPIDURAL INJECTION  12/09/2022   steroidal injections    Physical Exam   Triage Vital Signs: ED Triage Vitals  Encounter Vitals Group     BP 11/16/23 0427 (!) 154/91     Girls Systolic BP Percentile --      Girls Diastolic BP Percentile --      Boys Systolic BP Percentile --      Boys Diastolic BP Percentile --      Pulse Rate 11/16/23 0427 76     Resp 11/16/23 0427 16     Temp 11/16/23 0427 98.1 F (36.7 C)     Temp Source 11/16/23 0427 Oral     SpO2 11/16/23 0427 97 %     Weight 11/16/23 0426 285 lb  (129.3 kg)     Height 11/16/23 0426 5' 10 (1.778 m)     Head Circumference --      Peak Flow --      Pain Score 11/16/23 0426 7     Pain Loc --      Pain Education --      Exclude from Growth Chart --     Most recent vital signs: Vitals:   11/16/23 0427  BP: (!) 154/91  Pulse: 76  Resp: 16  Temp: 98.1 F (36.7 C)  SpO2: 97%    General: Awake, no distress.  CV:  Good peripheral perfusion.  Regular rate rhythm Resp:  Normal effort.  Abd:  No distention.  Soft, nontender, no CVA tenderness Other:  Moist oral mucosa   ED Results / Procedures / Treatments   Labs (all labs ordered are listed, but only abnormal results are displayed) Labs Reviewed  URINALYSIS, ROUTINE W REFLEX MICROSCOPIC - Abnormal; Notable for the following components:      Result Value   Color, Urine YELLOW (*)    APPearance CLEAR (*)    Hgb urine dipstick LARGE (*)    All other components within normal limits  BASIC METABOLIC PANEL WITH GFR -  Abnormal; Notable for the following components:   Glucose, Bld 127 (*)    Creatinine, Ser 1.62 (*)    GFR, Estimated 49 (*)    All other components within normal limits  CBC - Abnormal; Notable for the following components:   WBC 11.8 (*)    All other components within normal limits     EKG    RADIOLOGY CT abdomen pelvis interpreted by me, shows 5 mm stone in the left mid ureter without proximal hydroureter or hydronephrosis.  Radiology report reviewed   PROCEDURES:  Procedures   MEDICATIONS ORDERED IN ED: Medications  tamsulosin  (FLOMAX ) capsule 0.4 mg (has no administration in time range)  morphine  (PF) 4 MG/ML injection 4 mg (has no administration in time range)  ketorolac  (TORADOL ) 15 MG/ML injection 15 mg (15 mg Intravenous Given 11/16/23 0502)  sodium chloride  0.9 % bolus 1,000 mL (1,000 mLs Intravenous New Bag/Given 11/16/23 0502)     IMPRESSION / MDM / ASSESSMENT AND PLAN / ED COURSE  I reviewed the triage vital signs and the nursing  notes.  DDx: Ureterolithiasis, UTI, diverticulitis  Patient's presentation is most consistent with acute presentation with potential threat to life or bodily function.  Patient presents with left flank pain, worsening over the past few days.  Vitals and exam are reassuring.  CT reveals 5 mm left ureterolithiasis.   ----------------------------------------- 6:16 AM on 11/16/2023 ----------------------------------------- Radiology report confirms 6 mm ureterolithiasis on the left.  Creatinine is okay.  No signs of UTI.  Vital signs are unremarkable.  He is ambulatory and nontoxic.  Reports significant persistent pain.  Will give IV morphine , plan to discharge with prescriptions for symptom control and he could follow-up with his urologist.  Patient is comfortable with that plan.      FINAL CLINICAL IMPRESSION(S) / ED DIAGNOSES   Final diagnoses:  Ureterolithiasis     Rx / DC Orders   ED Discharge Orders          Ordered    naproxen (NAPROSYN) 500 MG tablet  2 times daily with meals        11/16/23 0616    oxyCODONE -acetaminophen  (PERCOCET) 5-325 MG tablet  Every 6 hours PRN        11/16/23 0616    tamsulosin  (FLOMAX ) 0.4 MG CAPS capsule  Daily        11/16/23 0616    ondansetron  (ZOFRAN -ODT) 4 MG disintegrating tablet  Every 8 hours PRN        11/16/23 0616             Note:  This document was prepared using Dragon voice recognition software and may include unintentional dictation errors.   Viviann Pastor, MD 11/16/23 [id number]    Viviann Pastor, MD 11/16/23 [phone]

## 2023-11-16 NOTE — ED Triage Notes (Signed)
 Patient ambulatory to triage with complaints of left flank and abdominal pain. Patient has known hx of stones and believes he passed one yesterday and had some hematuria with that. States since passing the one his flank pain has only worsened. Denies difficulty peeing.

## 2023-11-21 ENCOUNTER — Ambulatory Visit (HOSPITAL_BASED_OUTPATIENT_CLINIC_OR_DEPARTMENT_OTHER)
Admission: RE | Admit: 2023-11-21 | Discharge: 2023-11-21 | Disposition: A | Discharge status: Home / Self Care | Source: Ambulatory Visit | Attending: Urology | Admitting: Urology

## 2023-11-21 ENCOUNTER — Encounter: Payer: Self-pay | Admitting: Urology

## 2023-11-21 ENCOUNTER — Ambulatory Visit: Admitting: Urology

## 2023-11-21 VITALS — BP 147/92 | HR 67 | Ht 70.0 in | Wt 285.0 lb

## 2023-11-21 DIAGNOSIS — N202 Calculus of kidney with calculus of ureter: Secondary | ICD-10-CM | POA: Diagnosis not present

## 2023-11-21 DIAGNOSIS — N201 Calculus of ureter: Secondary | ICD-10-CM | POA: Insufficient documentation

## 2023-11-21 DIAGNOSIS — N200 Calculus of kidney: Secondary | ICD-10-CM

## 2023-11-21 LAB — URINALYSIS, ROUTINE W REFLEX MICROSCOPIC
Bilirubin, UA: NEGATIVE
Glucose, UA: NEGATIVE
Ketones, UA: NEGATIVE
Leukocytes,UA: NEGATIVE
Nitrite, UA: NEGATIVE
Specific Gravity, UA: 1.015 (ref 1.005–1.030)
Urobilinogen, Ur: 0.2 mg/dL (ref 0.2–1.0)
pH, UA: 6.5 (ref 5.0–7.5)

## 2023-11-21 LAB — MICROSCOPIC EXAMINATION

## 2023-11-21 MED ORDER — TAMSULOSIN HCL 0.4 MG PO CAPS: 0.4000 mg | ORAL_CAPSULE | Freq: Every day | ORAL | 1 refills | Status: AC

## 2023-11-21 MED ORDER — OXYCODONE-ACETAMINOPHEN 10-325 MG PO TABS: 1.0000 | ORAL_TABLET | Freq: Four times a day (QID) | ORAL | 0 refills | Status: AC | PRN

## 2023-11-21 NOTE — Progress Notes (Signed)
 Assessment: 1. Ureteral calculus   2. Nephrolithiasis     Plan: I reviewed the CT study from 11/16/2023 as well as the CT study from 09/10/2023. Provider notes and lab results also reviewed. KUB from today reviewed.  I do not see an obvious calcification along the expected course of the left ureter.  There is a faint calcification seen in the left side of the pelvis which could possibly be a distal ureteral calculus. I discussed the findings with the patient today.  We discussed management options for the left ureteral calculus. Continue tamsulosin . Strain urine. Refill of pain medicine provided. Return to office in 7-10 days.   Chief Complaint:  Chief Complaint  Patient presents with   Nephrolithiasis    History of Present Illness:  Scott Knox is a 58 y.o. male who is seen for further evaluation of ureteral calculus and nephrolithiasis.  He reports a history of nephrolithiasis dating back to December 25, 2019.  He has passed multiple stones over the past few years.  His most recent episode was in January 2025.  He had right sided flank pain.  His pain was severe enough that he presented to the emergency room for evaluation.  CT imaging from 02/13/2023 showed a 5 mm calculus in the bladder at the right UVJ with mild to moderate right hydronephrosis and numerous bilateral nonobstructing renal calculi.  He reported passing that stone approximately 2 days later.   At his visit in January 2025, he was not having any pain.  No dysuria or gross hematuria.  He had not required surgical intervention for his kidney stones.  Stone analysis:  95% calcium oxalate, 5% hydroxyapatite 24 hr urine:   - low urine volume (1.12 L/day) - low urine citrate  KUB from 1/25:  bilateral nephrolithiasis  He underwent left ESL for a mid left renal calculus on 04/17/2023. He has done well since the procedure.  At his visit on 04/25/23, he had not passed any significant stone fragments.  He had not had any flank pain,  dysuria, or gross hematuria. KUB from 04/25/23 showed fragmentation of the renal calculus. At his visit in May 2025, he reported passing a fairly sizable stone fragment. KUB showed small calcifications in the left renal shadow. Renal ultrasound from 07/04/2023 showed bilateral nonobstructive nephrolithiasis.  CT renal stone study from 09/10/2023 showed bilateral nonobstructing renal calculi and an obstructing 5 mm proximal left ureteral calculus with mild left-sided hydronephrosis. He reports passing that stone shortly after his ER visit.  He had a onset of left-sided flank pain approximately 1 week ago. CT renal stone study from 11/16/2023 done for left flank pain shows bilateral renal calculi and left hydronephrosis with a 6 mm calculus at the left UPJ. His pain actually improved yesterday.  He had increased pain this morning.  He is also having frequency and dysuria.  He did note gross hematuria last week.  No fevers or chills.  No current nausea or vomiting.  He has been on tamsulosin  and has been straining his urine.  He is not aware of passing a stone.   Portions of the above documentation were copied from a prior visit for review purposes only.   Past Medical History:  Past Medical History:  Diagnosis Date   Acid reflux    History of kidney stones    Hypertension     Past Surgical History:  Past Surgical History:  Procedure Laterality Date   EXTRACORPOREAL SHOCK WAVE LITHOTRIPSY Left 04/17/2023   Procedure: LITHOTRIPSY, ESWL;  Surgeon: Roseann Adine PARAS., MD;  Location: [facility];  Service: Urology;  Laterality: Left;   KNEE ARTHROSCOPY W/ MENISCAL REPAIR     LUMBAR EPIDURAL INJECTION  12/09/2022   steroidal injections    Allergies:  Allergies  Allergen Reactions   Bee Venom Anaphylaxis   Covid-19 (Mrna) Vaccine     Other Reaction(s): itchy lips, swollen face   Hydrochlorothiazide     Other Reaction(s): gout, low potassium    Family History:  No family history on  file.  Social History:  Social History   Tobacco Use   Smoking status: Never   Smokeless tobacco: Never  Vaping Use   Vaping status: Never Used  Substance Use Topics   Alcohol use: Not Currently   Drug use: No    ROS: Constitutional:  Negative for fever, chills, weight loss CV: Negative for chest pain, previous MI, hypertension Respiratory:  Negative for shortness of breath, wheezing, sleep apnea, frequent cough GI:  Negative for nausea, vomiting, bloody stool, GERD  Physical exam: BP (!) 147/92   Pulse 67   Ht 5' 10 (1.778 m)   Wt 285 lb (129.3 kg)   BMI 40.89 kg/m  GENERAL APPEARANCE:  Well appearing, well developed, well nourished, NAD HEENT:  Atraumatic, normocephalic, oropharynx clear NECK:  Supple without lymphadenopathy or thyromegaly ABDOMEN:  Soft, non-tender, no masses EXTREMITIES:  Moves all extremities well, without clubbing, cyanosis, or edema NEUROLOGIC:  Alert and oriented x 3, normal gait, CN II-XII grossly intact MENTAL STATUS:  appropriate BACK:  Non-tender to palpation, No CVAT SKIN:  Warm, dry, and intact  Results: U/A: 0-5 WBCs, 11-30 RBCs  KUB: Calcifications overlying the bilateral renal shadows; no clear calcification seen along the expected course of the left ureter; faint calcification measuring approximately 5 mm in width in the left pelvis which could be a distal ureteral calculus.

## 2023-11-29 ENCOUNTER — Telehealth: Payer: Self-pay | Admitting: Urology

## 2023-11-29 ENCOUNTER — Other Ambulatory Visit: Payer: Self-pay | Admitting: Urology

## 2023-11-29 MED ORDER — SULFAMETHOXAZOLE-TRIMETHOPRIM 800-160 MG PO TABS: 1.0000 | ORAL_TABLET | Freq: Two times a day (BID) | ORAL | 0 refills | Status: AC

## 2023-11-29 NOTE — Telephone Encounter (Signed)
 Pt called stated they passed kidney stone on Sunday pt stated they are having some UTI symptoms would like to see if Dr Roseann can send something in. Pt has apt 12/01/23.

## 2023-11-30 NOTE — Telephone Encounter (Signed)
 Pt.notified

## 2023-12-01 ENCOUNTER — Ambulatory Visit (INDEPENDENT_AMBULATORY_CARE_PROVIDER_SITE_OTHER): Admitting: Urology

## 2023-12-01 ENCOUNTER — Encounter: Payer: Self-pay | Admitting: Urology

## 2023-12-01 VITALS — BP 138/90 | HR 82 | Ht 70.0 in | Wt 285.0 lb

## 2023-12-01 DIAGNOSIS — N202 Calculus of kidney with calculus of ureter: Secondary | ICD-10-CM | POA: Diagnosis not present

## 2023-12-01 DIAGNOSIS — N201 Calculus of ureter: Secondary | ICD-10-CM

## 2023-12-01 DIAGNOSIS — N200 Calculus of kidney: Secondary | ICD-10-CM

## 2023-12-01 LAB — URINALYSIS, ROUTINE W REFLEX MICROSCOPIC
Bilirubin, UA: NEGATIVE
Glucose, UA: NEGATIVE
Ketones, UA: NEGATIVE
Leukocytes,UA: NEGATIVE
Nitrite, UA: NEGATIVE
Protein,UA: NEGATIVE
Specific Gravity, UA: 1.02 (ref 1.005–1.030)
Urobilinogen, Ur: 0.2 mg/dL (ref 0.2–1.0)
pH, UA: 6.5 (ref 5.0–7.5)

## 2023-12-01 LAB — MICROSCOPIC EXAMINATION

## 2023-12-01 NOTE — Progress Notes (Signed)
 Assessment: 1. Ureteral calculus   2. Nephrolithiasis     Plan: Continue stone prevention. Return to office in 2 months.  Chief Complaint:  Chief Complaint  Patient presents with   Nephrolithiasis    History of Present Illness:  Scott Knox is a 58 y.o. male who is seen for further evaluation of ureteral calculus and nephrolithiasis.  He reports a history of nephrolithiasis dating back to 12/15/19.  He has passed multiple stones over the past few years.  His most recent episode was in January 2025.  He had right sided flank pain.  His pain was severe enough that he presented to the emergency room for evaluation.  CT imaging from 02/13/2023 showed a 5 mm calculus in the bladder at the right UVJ with mild to moderate right hydronephrosis and numerous bilateral nonobstructing renal calculi.  He reported passing that stone approximately 2 days later.   At his visit in January 2025, he was not having any pain.  No dysuria or gross hematuria.  He had not required surgical intervention for his kidney stones.  Stone analysis:  95% calcium oxalate, 5% hydroxyapatite 24 hr urine:   - low urine volume (1.12 L/day) - low urine citrate  KUB from 1/25:  bilateral nephrolithiasis  He underwent left ESL for a mid left renal calculus on 04/17/2023. He has done well since the procedure.  At his visit on 04/25/23, he had not passed any significant stone fragments.  He had not had any flank pain, dysuria, or gross hematuria. KUB from 04/25/23 showed fragmentation of the renal calculus. At his visit in May 2025, he reported passing a fairly sizable stone fragment. KUB showed small calcifications in the left renal shadow. Renal ultrasound from 07/04/2023 showed bilateral nonobstructive nephrolithiasis.  CT renal stone study from 09/10/2023 showed bilateral nonobstructing renal calculi and an obstructing 5 mm proximal left ureteral calculus with mild left-sided hydronephrosis. He reports passing that stone  shortly after his ER visit.  He had a onset of left-sided flank pain in mid October 2025.  CT renal stone study from 11/16/2023 done for left flank pain showed bilateral renal calculi and left hydronephrosis with a 6 mm calculus at the left UPJ. At his visit on 11/21/23, he had increased pain.  He was also having frequency and dysuria.  He had a episode of gross hematuria 1 week prior.  No fevers or chills.  No nausea or vomiting.  He was on tamsulosin  and had been straining his urine.   KUB from 11/21/2023 showed a faint calcification in the left side of the pelvis, possibly a distal ureteral calculus. He passed a stone on 11/26/2023.  He returns today for follow-up.  He reports relief of his flank pain after passing the stone.  He did have 1 episode of mild left-sided pain 2 days ago.  He has had some dysuria which is improving.  No fevers or chills.  No gross hematuria.   Portions of the above documentation were copied from a prior visit for review purposes only.   Past Medical History:  Past Medical History:  Diagnosis Date   Acid reflux    History of kidney stones    Hypertension     Past Surgical History:  Past Surgical History:  Procedure Laterality Date   EXTRACORPOREAL SHOCK WAVE LITHOTRIPSY Left 04/17/2023   Procedure: LITHOTRIPSY, ESWL;  Surgeon: Roseann Adine PARAS., MD;  Location: [facility];  Service: Urology;  Laterality: Left;   KNEE ARTHROSCOPY W/ MENISCAL REPAIR  LUMBAR EPIDURAL INJECTION  12/09/2022   steroidal injections    Allergies:  Allergies  Allergen Reactions   Bee Venom Anaphylaxis   Covid-19 (Mrna) Vaccine     Other Reaction(s): itchy lips, swollen face   Hydrochlorothiazide     Other Reaction(s): gout, low potassium    Family History:  No family history on file.  Social History:  Social History   Tobacco Use   Smoking status: Never   Smokeless tobacco: Never  Vaping Use   Vaping status: Never Used  Substance Use Topics   Alcohol use:  Not Currently   Drug use: No    ROS: Constitutional:  Negative for fever, chills, weight loss CV: Negative for chest pain, previous MI, hypertension Respiratory:  Negative for shortness of breath, wheezing, sleep apnea, frequent cough GI:  Negative for nausea, vomiting, bloody stool, GERD  Physical exam: BP (!) 138/90   Pulse 82   Ht 5' 10 (1.778 m)   Wt 285 lb (129.3 kg)   BMI 40.89 kg/m  GENERAL APPEARANCE:  Well appearing, well developed, well nourished, NAD HEENT:  Atraumatic, normocephalic, oropharynx clear NECK:  Supple without lymphadenopathy or thyromegaly ABDOMEN:  Soft, non-tender, no masses EXTREMITIES:  Moves all extremities well, without clubbing, cyanosis, or edema NEUROLOGIC:  Alert and oriented x 3, normal gait, CN II-XII grossly intact MENTAL STATUS:  appropriate BACK:  Non-tender to palpation, No CVAT SKIN:  Warm, dry, and intact  Results: U/A: 0-5 WBC, 3-10 RBC

## 2023-12-26 ENCOUNTER — Encounter (HOSPITAL_COMMUNITY): Payer: Self-pay

## 2023-12-26 ENCOUNTER — Emergency Department (HOSPITAL_COMMUNITY)
Admission: EM | Admit: 2023-12-26 | Discharge: 2023-12-27 | Disposition: A | Attending: Emergency Medicine | Admitting: Emergency Medicine

## 2023-12-26 ENCOUNTER — Emergency Department (HOSPITAL_COMMUNITY)

## 2023-12-26 ENCOUNTER — Other Ambulatory Visit: Payer: Self-pay

## 2023-12-26 DIAGNOSIS — I10 Essential (primary) hypertension: Secondary | ICD-10-CM | POA: Diagnosis not present

## 2023-12-26 DIAGNOSIS — K760 Fatty (change of) liver, not elsewhere classified: Secondary | ICD-10-CM | POA: Insufficient documentation

## 2023-12-26 DIAGNOSIS — Z79899 Other long term (current) drug therapy: Secondary | ICD-10-CM | POA: Diagnosis not present

## 2023-12-26 DIAGNOSIS — R079 Chest pain, unspecified: Secondary | ICD-10-CM | POA: Insufficient documentation

## 2023-12-26 DIAGNOSIS — R1011 Right upper quadrant pain: Secondary | ICD-10-CM | POA: Diagnosis present

## 2023-12-26 LAB — LIPASE, BLOOD: Lipase: 25 U/L (ref 11–51)

## 2023-12-26 LAB — CBC
HCT: 43.4 % (ref 39.0–52.0)
Hemoglobin: 14.1 g/dL (ref 13.0–17.0)
MCH: 28.7 pg (ref 26.0–34.0)
MCHC: 32.5 g/dL (ref 30.0–36.0)
MCV: 88.4 fL (ref 80.0–100.0)
Platelets: 227 K/uL (ref 150–400)
RBC: 4.91 MIL/uL (ref 4.22–5.81)
RDW: 13 % (ref 11.5–15.5)
WBC: 8.9 K/uL (ref 4.0–10.5)
nRBC: 0 % (ref 0.0–0.2)

## 2023-12-26 LAB — COMPREHENSIVE METABOLIC PANEL WITH GFR
ALT: 23 U/L (ref 0–44)
AST: 20 U/L (ref 15–41)
Albumin: 3.9 g/dL (ref 3.5–5.0)
Alkaline Phosphatase: 62 U/L (ref 38–126)
Anion gap: 5 (ref 5–15)
BUN: 15 mg/dL (ref 6–20)
CO2: 30 mmol/L (ref 22–32)
Calcium: 9.4 mg/dL (ref 8.9–10.3)
Chloride: 105 mmol/L (ref 98–111)
Creatinine, Ser: 0.99 mg/dL (ref 0.61–1.24)
GFR, Estimated: >60 mL/min (ref >60)
Glucose, Bld: 105 mg/dL — ABNORMAL HIGH (ref 70–99)
Potassium: 3.6 mmol/L (ref 3.5–5.1)
Sodium: 140 mmol/L (ref 135–145)
Total Bilirubin: 0.5 mg/dL (ref 0.0–1.2)
Total Protein: 6.9 g/dL (ref 6.5–8.1)

## 2023-12-26 LAB — TROPONIN I (HIGH SENSITIVITY)
Troponin I (High Sensitivity): 7 ng/L (ref <18)
Troponin I (High Sensitivity): 8 ng/L (ref <18)

## 2023-12-26 NOTE — ED Provider Notes (Signed)
 [facility] AT [facility] Provider Note   CSN: [id number] Arrival date & time: 12/26/23  1433     Patient presents with: Chest Pain   Scott Knox is a 58 y.o. male.  {Add pertinent medical, surgical, social history, OB history to YEP:67052} The history is provided by the patient and medical records.  Chest Pain  58 year old male with history of hypertension, acid reflux, kidney stones, presenting to the ED for chest pain/upper abdominal pain.  States it started around lunch time after eating some peanut M&Ms.  He states he feels like it is in his lower chest/RUQ.  He denies associated SOB, nausea, vomiting, diaphoresis, pain in left arm/neck.  No prior cardiac hx.  He does admit elevated BP recently-- due for colonoscopy in the next few weeks so has been tracking his BP.  Has had some elevated readings recently, PCP contemplating increasing his dosing if not improving.  He states he is actually due for RUQ ultrasound in January.  Prior to Admission medications   Medication Sig Start Date End Date Taking? Authorizing Provider  amLODipine (NORVASC) 5 MG tablet Take 5 mg by mouth daily. 02/10/19   [provider]  carvedilol (COREG) 6.25 MG tablet Take 6.25 mg by mouth 2 (two) times daily with a meal.    [provider]  EPINEPHrine (EPI-PEN) 0.3 mg/0.3 mL SOAJ injection Inject 0.3 mg into the muscle once.    [provider]  famotidine (PEPCID) 20 MG tablet Take 20 mg by mouth daily.    [provider]  ibuprofen  (ADVIL ) 600 MG tablet Take 1 tablet (600 mg total) by mouth every 6 (six) hours as needed. 02/09/19   Alona Knee, PA-C  lansoprazole (PREVACID) 30 MG capsule Take 30 mg by mouth daily at 12 noon.    [provider]  losartan (COZAAR) 25 MG tablet Take 25 mg by mouth daily.    [provider]  MELATONIN GUMMIES PO Take 1 Piece of gum by mouth at bedtime as needed (sleep).    [provider]  naproxen  (NAPROSYN ) 500 MG tablet Take 1 tablet (500 mg total) by mouth 2 (two) times daily with a meal. 11/16/23   Viviann Pastor, MD  ondansetron  (ZOFRAN -ODT) 4 MG disintegrating tablet Take 1 tablet (4 mg total) by mouth every 8 (eight) hours as needed for nausea or vomiting. 11/16/23   Viviann Pastor, MD  oxyCODONE -acetaminophen  (PERCOCET) 10-325 MG tablet Take 1 tablet by mouth every 6 (six) hours as needed for pain. 11/21/23 11/20/24  Stoneking, Adine PARAS., MD  tamsulosin  (FLOMAX ) 0.4 MG CAPS capsule Take 1 capsule (0.4 mg total) by mouth daily. Discontinue after symptoms improve 11/21/23   Stoneking, Adine PARAS., MD    Allergies: Bee venom, Covid-19 (mrna) vaccine, and Hydrochlorothiazide    Review of Systems  Cardiovascular:  Positive for chest pain.  All other systems reviewed and are negative.   Updated Vital Signs BP (!) 140/86   Pulse 66   Temp 98.1 F (36.7 C)   Resp 11   Ht 5' 10.5 (1.791 m)   Wt 129.3 kg   SpO2 97%   BMI 40.32 kg/m   Physical Exam Vitals and nursing note reviewed.  Constitutional:      Appearance: He is well-developed.  HENT:     Head: Normocephalic and atraumatic.  Eyes:     Conjunctiva/sclera: Conjunctivae normal.     Pupils: Pupils are equal, round, and reactive to light.  Cardiovascular:  Rate and Rhythm: Normal rate and regular rhythm.     Heart sounds: Normal heart sounds.  Pulmonary:     Effort: Pulmonary effort is normal.     Breath sounds: Normal breath sounds.  Abdominal:     General: Bowel sounds are normal.     Palpations: Abdomen is soft.  Musculoskeletal:        General: Normal range of motion.     Cervical back: Normal range of motion.  Skin:    General: Skin is warm and dry.  Neurological:     Mental Status: He is alert and oriented to person, place, and time.     (all labs ordered are listed, but only abnormal results are displayed) Labs Reviewed  COMPREHENSIVE METABOLIC PANEL WITH GFR -  Abnormal; Notable for the following components:      Result Value   Glucose, Bld 105 (*)    All other components within normal limits  CBC  LIPASE, BLOOD  TROPONIN I (HIGH SENSITIVITY)  TROPONIN I (HIGH SENSITIVITY)    EKG: None  Radiology: DG Chest 2 View Result Date: 12/26/2023 CLINICAL DATA:  Chest pain.  Elevated blood pressure. EXAM: CHEST - 2 VIEW COMPARISON:  Radiograph 12/29/2012 FINDINGS: The cardiomediastinal contours are normal. The lungs are clear. Pulmonary vasculature is normal. No consolidation, pleural effusion, or pneumothorax. No acute osseous abnormalities are seen. IMPRESSION: No active cardiopulmonary disease. Electronically Signed   By: Andrea Gasman M.D.   On: 12/26/2023 16:41    {Document cardiac monitor, telemetry assessment procedure when appropriate:32947} Procedures   Medications Ordered in the ED - No data to display    {Click here for ABCD2, HEART and other calculators REFRESH Note before signing:1}                              Medical Decision Making Amount and/or Complexity of Data Reviewed Labs: ordered. Radiology: ordered.   ***  {Document critical care time when appropriate  Document review of labs and clinical decision tools ie [id number], etc  Document your independent review of radiology images and any outside records  Document your discussion with family members, caretakers and with consultants  Document social determinants of health affecting pt's care  Document your decision making why or why not admission, treatments were needed:32947:::1}   Final diagnoses:  None    ED Discharge Orders     None

## 2023-12-26 NOTE — ED Triage Notes (Signed)
 Here by POV from home for CP. Pinpoints to RUQ and mid lower chest. H/o acid reflux and HTN. Took all BP meds today. Denies sx other than CP. Walked from parking deck and pain was aggravated. Onset 1200. No h/o similar. Pt alert, NAD, calm, interactive, resps e/u, speaking in clear complete sentences. Sitting in w/c, Skin W&D.

## 2023-12-27 ENCOUNTER — Emergency Department (HOSPITAL_COMMUNITY)

## 2023-12-27 NOTE — ED Notes (Signed)
 Patient transported to Ultrasound

## 2023-12-27 NOTE — Discharge Instructions (Addendum)
 Cardiac work-up today was reassuring.  Ultrasound with findings of fatty liver like we discussed-- this just needs to be monitored.  Liver tests were normal. Recommend close follow-up with your primary care doctor. Return here for new concerns.

## 2024-01-31 ENCOUNTER — Ambulatory Visit: Admitting: Urology
